# Patient Record
Sex: Female | Born: 1995 | Race: White | Hispanic: No | Marital: Single | Smoking: Never smoker
Health system: Southern US, Community
[De-identification: ages and names within clinical notes are randomized; demographics above are authoritative.]

## PROBLEM LIST (undated history)

## (undated) HISTORY — PX: TONSILLECTOMY: SUR1361

---
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
## 2018-04-16 ENCOUNTER — Other Ambulatory Visit: Payer: Self-pay

## 2018-04-16 ENCOUNTER — Encounter: Payer: Self-pay | Admitting: Family Medicine

## 2018-04-16 ENCOUNTER — Ambulatory Visit (INDEPENDENT_AMBULATORY_CARE_PROVIDER_SITE_OTHER): Payer: BLUE CROSS/BLUE SHIELD | Admitting: Family Medicine

## 2018-04-16 VITALS — BP 111/73 | HR 93 | Temp 100.4°F | Ht 62.0 in | Wt 158.0 lb

## 2018-04-16 DIAGNOSIS — J0100 Acute maxillary sinusitis, unspecified: Secondary | ICD-10-CM | POA: Diagnosis not present

## 2018-04-16 DIAGNOSIS — R6889 Other general symptoms and signs: Secondary | ICD-10-CM | POA: Diagnosis not present

## 2018-04-16 LAB — VERITOR FLU A/B WAIVED
Influenza A: NEGATIVE
Influenza B: NEGATIVE

## 2018-04-16 MED ORDER — AMOXICILLIN-POT CLAVULANATE 875-125 MG PO TABS
1.0000 | ORAL_TABLET | Freq: Two times a day (BID) | ORAL | 0 refills | Status: DC
Start: 2018-04-16 — End: 2018-04-18

## 2018-04-16 MED ORDER — FLUTICASONE PROPIONATE 50 MCG/ACT NA SUSP: 2.0000 | Freq: Every day | NASAL | 6 refills | Status: DC

## 2018-04-16 NOTE — Patient Instructions (Addendum)

## 2018-04-16 NOTE — Progress Notes (Signed)
    Subjective:     Sonia Manning is a 23 y.o. female who presents for evaluation of sinus pain. Symptoms include: clear rhinorrhea, congestion, facial pain, fevers, post nasal drip and sinus pressure. Onset of symptoms was 2 days ago. Symptoms have been gradually worsening since that time. Past history is significant for no history of pneumonia or bronchitis. Patient is a non-smoker. No cough, shortness of breath, or recent travel. No exposure to people who have recently traveled.   The following portions of the patient's history were reviewed and updated as appropriate: allergies, current medications, past family history, past medical history, past social history, past surgical history and problem list.  Review of Systems Constitutional: positive for chills and fevers Eyes: pressure behind eyes Ears, nose, mouth, throat, and face: positive for sinus pressure, rhinorrhea, and post nasal draiange Respiratory: negative Cardiovascular: negative Gastrointestinal: negative Musculoskeletal:negative Neurological: negative   Objective:    BP 111/73   Pulse 93   Temp (!) 100.4 F (38 C) (Oral)   Ht 5\' 2"  (1.575 m)   Wt 158 lb (71.7 kg)   LMP 04/16/2018 (Exact Date)   BMI 28.90 kg/m  General appearance: alert, cooperative, appears stated age and mild distress Head: Normocephalic, without obvious abnormality, atraumatic Eyes: conjunctivae/corneas clear. PERRL, EOM's intact. Fundi benign. Ears: normal TM's and external ear canals both ears Nose: clear and scant discharge, turbinates red, swollen, mild maxillary sinus tenderness bilateral Throat: abnormal findings: mild oropharyngeal erythema Neck: no adenopathy, no carotid bruit, no JVD, supple, symmetrical, trachea midline and thyroid not enlarged, symmetric, no tenderness/mass/nodules Lungs: clear to auscultation bilaterally Skin: Skin color, texture, turgor normal. No rashes or lesions Neurologic: Grossly normal     Influenza  negative in office.  Assessment:   Sonia Manning was seen today for headache, nausea, chills.  Diagnoses and all orders for this visit:  Acute non-recurrent maxillary sinusitis Due to sudden onset and worsening of symptoms, will treat with Augmentin. Symptomatic care discussed. Report any new or worsening symptoms.  -     amoxicillin-clavulanate (AUGMENTIN) 875-125 MG tablet; Take 1 tablet by mouth 2 (two) times daily for 7 days. -     fluticasone (FLONASE) 50 MCG/ACT nasal spray; Place 2 sprays into both nostrils daily.  Flu-like symptoms Influenza negative.  -     Veritor Flu A/B Waived     Plan:    Nasal saline sprays. Nasal steroids per medication orders. Augmentin per medication orders.   Return if symptoms worsen or fail to improve.  The above assessment and management plan was discussed with the patient. The patient verbalized understanding of and has agreed to the management plan. Patient is aware to call the clinic if symptoms fail to improve or worsen. Patient is aware when to return to the clinic for a follow-up visit. Patient educated on when it is appropriate to go to the emergency department.   Kari Baars, FNP-C [facility] [address] [place], [place] [address] [phone]

## 2018-04-18 ENCOUNTER — Telehealth: Payer: Self-pay | Admitting: *Deleted

## 2018-04-18 MED ORDER — DOXYCYCLINE HYCLATE 100 MG PO TABS: 100.0000 mg | ORAL_TABLET | Freq: Two times a day (BID) | ORAL | 0 refills | Status: DC

## 2018-04-18 NOTE — Telephone Encounter (Signed)
Pt states that augment is making her sick, nausea.  MMM gave verbal for switch to DOXY - pt aware

## 2019-12-11 ENCOUNTER — Ambulatory Visit
Admission: EM | Admit: 2019-12-11 | Discharge: 2019-12-11 | Disposition: A | Discharge status: Home / Self Care | Payer: Self-pay | Attending: Emergency Medicine | Admitting: Emergency Medicine

## 2019-12-11 ENCOUNTER — Encounter: Payer: Self-pay | Admitting: Emergency Medicine

## 2019-12-11 ENCOUNTER — Other Ambulatory Visit: Payer: Self-pay

## 2019-12-11 DIAGNOSIS — R509 Fever, unspecified: Secondary | ICD-10-CM

## 2019-12-11 DIAGNOSIS — R0602 Shortness of breath: Secondary | ICD-10-CM

## 2019-12-11 DIAGNOSIS — R059 Cough, unspecified: Secondary | ICD-10-CM

## 2019-12-11 MED ORDER — IBUPROFEN 800 MG PO TABS
800.0000 mg | ORAL_TABLET | Freq: Once | ORAL | Status: AC
  Administered 2019-12-11: 800 mg via ORAL

## 2019-12-11 MED ORDER — BENZONATATE 100 MG PO CAPS: 100.0000 mg | ORAL_CAPSULE | Freq: Three times a day (TID) | ORAL | 0 refills | Status: AC

## 2019-12-11 NOTE — ED Provider Notes (Signed)
[facility]   [id number] 12/11/19 Arrival Time: 1812   CC: cough, fever, SOB  SUBJECTIVE: History from: patient.  Sonia Manning is a 24 y.o. female who presents with green productive cough x 1 week, and SOB and fever, tmax 103, that started last night.  Denies sick exposure to COVID, flu or strep.  Has tried OTC medications without relief.  Symptoms are made worse with deep breath.  Reports previous covid infection in the past.   Denies sinus pain, rhinorrhea, sore throat, wheezing, chest pain, nausea, changes in bowel or bladder habits.    Denies calf pain or swelling, recent long travel, recent surgery, hormone use, tobacco use, malignancy, pregnancy, hx of blood clot.     ROS: As per HPI.  All other pertinent ROS negative.     History reviewed. No pertinent past medical history. History reviewed. No pertinent surgical history. No Known Allergies No current facility-administered medications on file prior to encounter.   Current Outpatient Medications on File Prior to Encounter  Medication Sig Dispense Refill  . doxycycline (VIBRA-TABS) 100 MG tablet Take 1 tablet (100 mg total) by mouth 2 (two) times daily. 1 po bid 20 tablet 0  . fluticasone (FLONASE) 50 MCG/ACT nasal spray Place 2 sprays into both nostrils daily. 16 g 6   Social History   Socioeconomic History  . Marital status: Single    Spouse name: Not on file  . Number of children: Not on file  . Years of education: Not on file  . Highest education level: Not on file  Occupational History  . Not on file  Tobacco Use  . Smoking status: Never Smoker  . Smokeless tobacco: Never Used  Vaping Use  . Vaping Use: Every day  Substance and Sexual Activity  . Alcohol use: No  . Drug use: No  . Sexual activity: Not on file  Other Topics Concern  . Not on file  Social History Narrative  . Not on file   Social Determinants of Health   Financial Resource Strain:   . Difficulty of Paying Living Expenses:  Not on file  Food Insecurity:   . Worried About Programme researcher, broadcasting/film/video in the Last Year: Not on file  . Ran Out of Food in the Last Year: Not on file  Transportation Needs:   . Lack of Transportation (Medical): Not on file  . Lack of Transportation (Non-Medical): Not on file  Physical Activity:   . Days of Exercise per Week: Not on file  . Minutes of Exercise per Session: Not on file  Stress:   . Feeling of Stress : Not on file  Social Connections:   . Frequency of Communication with Friends and Family: Not on file  . Frequency of Social Gatherings with Friends and Family: Not on file  . Attends Religious Services: Not on file  . Active Member of Clubs or Organizations: Not on file  . Attends Banker Meetings: Not on file  . Marital Status: Not on file  Intimate Partner Violence:   . Fear of Current or Ex-Partner: Not on file  . Emotionally Abused: Not on file  . Physically Abused: Not on file  . Sexually Abused: Not on file   Family History  Problem Relation Age of Onset  . Healthy Mother   . Hyperlipidemia Father   . Healthy Sister   . Healthy Brother   . Healthy Sister     OBJECTIVE:  Vitals:   12/11/19 1834  BP:  111/66  Pulse: (!) 134  Resp: 20  Temp: (!) 103.1 F (39.5 C)  TempSrc: Oral  SpO2: 98%     General appearance: alert; appears fatigued, but nontoxic; speaking in full sentences and tolerating own secretions HEENT: NCAT; Ears: EACs clear, TMs pearly gray; Eyes: PERRL.  EOM grossly intact. Nose: nares patent without rhinorrhea, Throat: oropharynx clear, tonsils non erythematous or enlarged, uvula midline  Neck: supple without LAD Lungs: unlabored respirations, symmetrical air entry; cough: mild; no respiratory distress; CTAB Heart: Tachycardia Skin: warm and dry Psychological: alert and cooperative; normal mood and affect   ASSESSMENT & PLAN:  1. Fever, unspecified   2. Cough   3. SOB (shortness of breath)     Meds ordered this  encounter  Medications  . ibuprofen (ADVIL) tablet 800 mg    Unable to rule out blood clot in urgent care setting.  Offered patient further evaluation and management in the ED.  Patient declines at this time and would like to try outpatient therapy first.  Aware of the risk associated with this decision including missed diagnosis, organ damage, organ failure, and/or death.  Patient aware and in agreement.     Declines chest x-ray at this time COVID testing ordered.  It will take between 5-7 days for test results.  Someone will contact you regarding abnormal results.    In the meantime: You should remain isolated in your home for 10 days from symptom onset AND greater than 72 hours after symptoms resolution (absence of fever without the use of fever-reducing medication and improvement in respiratory symptoms), whichever is longer Get plenty of rest and push fluids Tessalon Perles prescribed for cough Use OTC zyrtec for nasal congestion, runny nose, and/or sore throat Use OTC flonase for nasal congestion and runny nose Use medications daily for symptom relief Use OTC medications like ibuprofen or tylenol as needed fever or pain Call or go to the ED if you have any new or worsening symptoms such as fever, worsening cough, shortness of breath, chest tightness, chest pain, turning blue, changes in mental status, etc...   Reviewed expectations re: course of current medical issues. Questions answered. Outlined signs and symptoms indicating need for more acute intervention. Patient verbalized understanding. After Visit Summary given.         Rennis Harding, PA-C 12/11/19 1854

## 2019-12-11 NOTE — Discharge Instructions (Addendum)
Unable to rule out blood clot in urgent care setting.  Offered patient further evaluation and management in the ED.  Patient declines at this time and would like to try outpatient therapy first.  Aware of the risk associated with this decision including missed diagnosis, organ damage, organ failure, and/or death.  Patient aware and in agreement.     Declines chest x-ray at this time COVID testing ordered.  It will take between 5-7 days for test results.  Someone will contact you regarding abnormal results.    In the meantime: You should remain isolated in your home for 10 days from symptom onset AND greater than 72 hours after symptoms resolution (absence of fever without the use of fever-reducing medication and improvement in respiratory symptoms), whichever is longer Get plenty of rest and push fluids Tessalon Perles prescribed for cough Use OTC zyrtec for nasal congestion, runny nose, and/or sore throat Use OTC flonase for nasal congestion and runny nose Use medications daily for symptom relief Use OTC medications like ibuprofen or tylenol as needed fever or pain Call or go to the ED if you have any new or worsening symptoms such as fever, worsening cough, shortness of breath, chest tightness, chest pain, turning blue, changes in mental status, etc..Marland Kitchen

## 2019-12-11 NOTE — ED Triage Notes (Signed)
Pt has cough that started 1 week ago. Test covid neg by pcr from [facility] on Friday. Pt is now having shortness of breath and fever.

## 2019-12-12 ENCOUNTER — Emergency Department (HOSPITAL_COMMUNITY)
Admission: EM | Admit: 2019-12-12 | Discharge: 2019-12-12 | Disposition: A | Discharge status: Home / Self Care | Payer: Self-pay | Attending: Emergency Medicine | Admitting: Emergency Medicine

## 2019-12-12 ENCOUNTER — Encounter (HOSPITAL_COMMUNITY): Payer: Self-pay

## 2019-12-12 ENCOUNTER — Emergency Department (HOSPITAL_COMMUNITY): Payer: Self-pay

## 2019-12-12 ENCOUNTER — Other Ambulatory Visit: Payer: Self-pay

## 2019-12-12 DIAGNOSIS — R000 Tachycardia, unspecified: Secondary | ICD-10-CM | POA: Insufficient documentation

## 2019-12-12 DIAGNOSIS — R058 Other specified cough: Secondary | ICD-10-CM | POA: Insufficient documentation

## 2019-12-12 DIAGNOSIS — R509 Fever, unspecified: Secondary | ICD-10-CM | POA: Insufficient documentation

## 2019-12-12 DIAGNOSIS — J069 Acute upper respiratory infection, unspecified: Secondary | ICD-10-CM

## 2019-12-12 DIAGNOSIS — M791 Myalgia, unspecified site: Secondary | ICD-10-CM | POA: Insufficient documentation

## 2019-12-12 LAB — COVID-19, FLU A+B AND RSV
Influenza A, NAA: NOT DETECTED
Influenza B, NAA: NOT DETECTED
RSV, NAA: NOT DETECTED
SARS-CoV-2, NAA: NOT DETECTED

## 2019-12-12 MED ORDER — ACETAMINOPHEN 325 MG PO TABS
650.0000 mg | ORAL_TABLET | Freq: Once | ORAL | Status: AC | PRN
  Administered 2019-12-12: 650 mg via ORAL
  Filled 2019-12-12: qty 2

## 2019-12-12 NOTE — ED Triage Notes (Signed)
Pt presents to ED with complaints of fever up to 104 today and cough started last night. Pt seen at Urgent Care last night, Covid test pending.

## 2019-12-12 NOTE — Discharge Instructions (Addendum)
It was our pleasure to provide your ER care today - we hope that you feel better.  Rest. Drink plenty of fluids.  Take acetaminophen and/or ibuprofen as need for fever/body aches.   Follow up with your COVID/flu test results tomorrow.   Return to ER if worse, new symptoms, increased trouble breathing, new or severe pain, severe headache, abdominal pain, persistent vomiting, or other concern.

## 2019-12-12 NOTE — ED Provider Notes (Signed)
[facility] EMERGENCY DEPARTMENT Provider Note   CSN: [id number] Arrival date & time: 12/12/19  1653     History Chief Complaint  Patient presents with  . Fever    Sonia Manning is a 24 y.o. female.  Patient presents with fever, and non prod cough. Symptoms acute onset in past week, moderate, persistent. States covid test at onset symptoms neg, but that she also had a covid/rsv/flu test sent last evening - does not have those results yet. Denies sob. No chest pain. +body/muscle aches. No sore throat or runny nose. No sinus pain. No headache. No ear pain. No neck pain or stiffness. No abd or pelvic pain. No rash. No specific known ill contacts.   The history is provided by the patient.  Fever Associated symptoms: cough and myalgias   Associated symptoms: no chest pain, no confusion, no diarrhea, no dysuria, no ear pain, no headaches, no rash, no sore throat and no vomiting        History reviewed. No pertinent past medical history.  There are no problems to display for this patient.   Past Surgical History:  Procedure Laterality Date  . TONSILLECTOMY       OB History   No obstetric history on file.     Family History  Problem Relation Age of Onset  . Healthy Mother   . Hyperlipidemia Father   . Healthy Sister   . Healthy Brother   . Healthy Sister     Social History   Tobacco Use  . Smoking status: Never Smoker  . Smokeless tobacco: Never Used  Vaping Use  . Vaping Use: Every day  Substance Use Topics  . Alcohol use: No  . Drug use: No    Home Medications Prior to Admission medications   Medication Sig Start Date End Date Taking? Authorizing Provider  benzonatate (TESSALON) 100 MG capsule Take 1 capsule (100 mg total) by mouth every 8 (eight) hours. 12/11/19   Wurst, [place], PA-C  doxycycline (VIBRA-TABS) 100 MG tablet Take 1 tablet (100 mg total) by mouth 2 (two) times daily. 1 po bid 04/18/18   Daphine Deutscher, Mary-Margaret, FNP  fluticasone (FLONASE) 50  MCG/ACT nasal spray Place 2 sprays into both nostrils daily. 04/16/18   Sonny Masters, FNP    Allergies    Patient has no known allergies.  Review of Systems   Review of Systems  Constitutional: Positive for fever.  HENT: Negative for ear pain, sinus pain and sore throat.   Eyes: Negative for discharge and redness.  Respiratory: Positive for cough. Negative for shortness of breath.   Cardiovascular: Negative for chest pain.  Gastrointestinal: Negative for diarrhea and vomiting.  Genitourinary: Negative for dysuria, flank pain, vaginal bleeding and vaginal discharge.  Musculoskeletal: Positive for myalgias. Negative for back pain, neck pain and neck stiffness.  Skin: Negative for rash.  Neurological: Negative for headaches.  Hematological: Negative for adenopathy.  Psychiatric/Behavioral: Negative for confusion.    Physical Exam Updated Vital Signs BP 110/66 (BP Location: Right Arm)   Pulse (!) 127   Temp (!) 100.6 F (38.1 C) (Oral)   Resp 20   Ht 1.626 m (5\' 4" )   Wt 54.4 kg   LMP 12/03/2019   SpO2 99%   BMI 20.60 kg/m   Physical Exam Vitals and nursing note reviewed.  Constitutional:      Appearance: Normal appearance. She is well-developed.     Comments: Febrile.  HENT:     Head: Atraumatic.  Right Ear: Tympanic membrane normal.     Left Ear: Tympanic membrane normal.     Nose: Nose normal.     Mouth/Throat:     Mouth: Mucous membranes are moist.     Pharynx: Oropharynx is clear. No oropharyngeal exudate or posterior oropharyngeal erythema.  Eyes:     General: No scleral icterus.    Conjunctiva/sclera: Conjunctivae normal.     Pupils: Pupils are equal, round, and reactive to light.  Neck:     Trachea: No tracheal deviation.     Comments: No stiffness or rigidity.  Cardiovascular:     Rate and Rhythm: Regular rhythm. Tachycardia present.     Pulses: Normal pulses.     Heart sounds: Normal heart sounds. No murmur heard.  No friction rub. No gallop.     Pulmonary:     Effort: Pulmonary effort is normal. No respiratory distress.     Breath sounds: Normal breath sounds.  Abdominal:     General: Bowel sounds are normal. There is no distension.     Palpations: Abdomen is soft.     Tenderness: There is no abdominal tenderness. There is no guarding.  Genitourinary:    Comments: No cva tenderness.  Musculoskeletal:        General: No swelling.     Cervical back: Normal range of motion and neck supple. No rigidity or tenderness. No muscular tenderness.  Lymphadenopathy:     Cervical: No cervical adenopathy.  Skin:    General: Skin is warm and dry.     Findings: No rash.  Neurological:     Mental Status: She is alert.     Comments: Alert, speech normal. Steady gait.   Psychiatric:        Mood and Affect: Mood normal.     ED Results / Procedures / Treatments   Labs (all labs ordered are listed, but only abnormal results are displayed) Labs Reviewed - No data to display  EKG None  Radiology DG Chest Portable 1 View  Result Date: 12/12/2019 CLINICAL DATA:  Fever, cough, short of breath EXAM: PORTABLE CHEST 1 VIEW COMPARISON:  None. FINDINGS: The heart size and mediastinal contours are within normal limits. Both lungs are clear. The visualized skeletal structures are unremarkable. IMPRESSION: No active disease. Electronically Signed   By: Sharlet Salina M.D.   On: 12/12/2019 17:49    Procedures Procedures (including critical care time)  Medications Ordered in ED Medications  acetaminophen (TYLENOL) tablet 650 mg (650 mg Oral Given 12/12/19 1710)    ED Course  I have reviewed the triage vital signs and the nursing notes.  Pertinent labs & imaging results that were available during my care of the patient were reviewed by me and considered in my medical decision making (see chart for details).    MDM Rules/Calculators/A&P                         No meds pta except ibuprofen. Febrile on arrival. Acetaminophen, po  fluids.  Reviewed nursing notes and prior charts for additional history. Yesterday's covid test pending.  CXR.  CXR reviewed/interpreted by me - no pna.   Pt is breathing comfortably, is not hypoxic, and appears adequately hydrated, and in no acute distress.  Suspect viral syndrome/uri.   Rec f/u for her covid/flu results tomorrow.   Return precautions provided.     Final Clinical Impression(s) / ED Diagnoses Final diagnoses:  None    Rx / DC Orders  ED Discharge Orders    None       Cathren Laine, MD 12/12/19 [phone]

## 2019-12-13 ENCOUNTER — Encounter (HOSPITAL_COMMUNITY): Payer: Self-pay | Admitting: Emergency Medicine

## 2019-12-13 ENCOUNTER — Inpatient Hospital Stay (HOSPITAL_COMMUNITY)
Admission: EM | Admit: 2019-12-13 | Discharge: 2019-12-19 | DRG: 871 | Disposition: A | Discharge status: Home / Self Care | Payer: Self-pay | Attending: Internal Medicine | Admitting: Internal Medicine

## 2019-12-13 ENCOUNTER — Emergency Department (HOSPITAL_COMMUNITY): Payer: Self-pay

## 2019-12-13 ENCOUNTER — Other Ambulatory Visit: Payer: Self-pay

## 2019-12-13 DIAGNOSIS — E861 Hypovolemia: Secondary | ICD-10-CM | POA: Diagnosis present

## 2019-12-13 DIAGNOSIS — N179 Acute kidney failure, unspecified: Secondary | ICD-10-CM | POA: Diagnosis present

## 2019-12-13 DIAGNOSIS — E871 Hypo-osmolality and hyponatremia: Secondary | ICD-10-CM | POA: Diagnosis present

## 2019-12-13 DIAGNOSIS — R040 Epistaxis: Secondary | ICD-10-CM | POA: Diagnosis not present

## 2019-12-13 DIAGNOSIS — R6521 Severe sepsis with septic shock: Secondary | ICD-10-CM | POA: Diagnosis present

## 2019-12-13 DIAGNOSIS — D649 Anemia, unspecified: Secondary | ICD-10-CM | POA: Diagnosis present

## 2019-12-13 DIAGNOSIS — R911 Solitary pulmonary nodule: Secondary | ICD-10-CM | POA: Diagnosis present

## 2019-12-13 DIAGNOSIS — R109 Unspecified abdominal pain: Secondary | ICD-10-CM

## 2019-12-13 DIAGNOSIS — E876 Hypokalemia: Secondary | ICD-10-CM | POA: Diagnosis present

## 2019-12-13 DIAGNOSIS — A419 Sepsis, unspecified organism: Secondary | ICD-10-CM | POA: Diagnosis present

## 2019-12-13 DIAGNOSIS — Z20822 Contact with and (suspected) exposure to covid-19: Secondary | ICD-10-CM | POA: Diagnosis present

## 2019-12-13 DIAGNOSIS — B373 Candidiasis of vulva and vagina: Secondary | ICD-10-CM | POA: Diagnosis present

## 2019-12-13 DIAGNOSIS — N3000 Acute cystitis without hematuria: Secondary | ICD-10-CM

## 2019-12-13 DIAGNOSIS — N10 Acute tubulo-interstitial nephritis: Secondary | ICD-10-CM | POA: Diagnosis present

## 2019-12-13 DIAGNOSIS — D6959 Other secondary thrombocytopenia: Secondary | ICD-10-CM | POA: Diagnosis present

## 2019-12-13 DIAGNOSIS — N289 Disorder of kidney and ureter, unspecified: Secondary | ICD-10-CM

## 2019-12-13 DIAGNOSIS — A4151 Sepsis due to Escherichia coli [E. coli]: Principal | ICD-10-CM | POA: Diagnosis present

## 2019-12-13 DIAGNOSIS — R0602 Shortness of breath: Secondary | ICD-10-CM

## 2019-12-13 LAB — COMPREHENSIVE METABOLIC PANEL
ALT: 14 U/L (ref 0–44)
AST: 19 U/L (ref 15–41)
Albumin: 3.6 g/dL (ref 3.5–5.0)
Alkaline Phosphatase: 57 U/L (ref 38–126)
Anion gap: 13 (ref 5–15)
BUN: 16 mg/dL (ref 6–20)
CO2: 22 mmol/L (ref 22–32)
Calcium: 9.1 mg/dL (ref 8.9–10.3)
Chloride: 97 mmol/L — ABNORMAL LOW (ref 98–111)
Creatinine, Ser: 1.51 mg/dL — ABNORMAL HIGH (ref 0.44–1.00)
GFR, Estimated: 50 mL/min — ABNORMAL LOW (ref >60)
Glucose, Bld: 109 mg/dL — ABNORMAL HIGH (ref 70–99)
Potassium: 3.5 mmol/L (ref 3.5–5.1)
Sodium: 132 mmol/L — ABNORMAL LOW (ref 135–145)
Total Bilirubin: 1.2 mg/dL (ref 0.3–1.2)
Total Protein: 7.2 g/dL (ref 6.5–8.1)

## 2019-12-13 LAB — URINALYSIS, ROUTINE W REFLEX MICROSCOPIC
Bilirubin Urine: NEGATIVE
Glucose, UA: NEGATIVE mg/dL
Ketones, ur: 5 mg/dL — AB
Nitrite: NEGATIVE
Protein, ur: 100 mg/dL — AB
Specific Gravity, Urine: 1.009 (ref 1.005–1.030)
WBC, UA: >50 WBC/hpf — ABNORMAL HIGH (ref 0–5)
pH: 5 (ref 5.0–8.0)

## 2019-12-13 LAB — CBC WITH DIFFERENTIAL/PLATELET
Abs Immature Granulocytes: 0.12 10*3/uL — ABNORMAL HIGH (ref 0.00–0.07)
Basophils Absolute: 0 10*3/uL (ref 0.0–0.1)
Basophils Relative: 0 %
Eosinophils Absolute: 0 10*3/uL (ref 0.0–0.5)
Eosinophils Relative: 0 %
HCT: 35.7 % — ABNORMAL LOW (ref 36.0–46.0)
Hemoglobin: 12.2 g/dL (ref 12.0–15.0)
Immature Granulocytes: 1 %
Lymphocytes Relative: 4 %
Lymphs Abs: 0.5 10*3/uL — ABNORMAL LOW (ref 0.7–4.0)
MCH: 29.4 pg (ref 26.0–34.0)
MCHC: 34.2 g/dL (ref 30.0–36.0)
MCV: 86 fL (ref 80.0–100.0)
Monocytes Absolute: 1.8 10*3/uL — ABNORMAL HIGH (ref 0.1–1.0)
Monocytes Relative: 15 %
Neutro Abs: 9.8 10*3/uL — ABNORMAL HIGH (ref 1.7–7.7)
Neutrophils Relative %: 80 %
Platelets: 148 10*3/uL — ABNORMAL LOW (ref 150–400)
RBC: 4.15 MIL/uL (ref 3.87–5.11)
RDW: 13.5 % (ref 11.5–15.5)
WBC: 12.2 10*3/uL — ABNORMAL HIGH (ref 4.0–10.5)
nRBC: 0 % (ref 0.0–0.2)

## 2019-12-13 LAB — D-DIMER, QUANTITATIVE: D-Dimer, Quant: 2.12 ug/mL-FEU — ABNORMAL HIGH (ref 0.00–0.50)

## 2019-12-13 LAB — PROTIME-INR
INR: 1.1 (ref 0.8–1.2)
Prothrombin Time: 13.7 seconds (ref 11.4–15.2)

## 2019-12-13 LAB — LIPASE, BLOOD: Lipase: 28 U/L (ref 11–51)

## 2019-12-13 LAB — HCG, QUANTITATIVE, PREGNANCY: hCG, Beta Chain, Quant, S: 1 m[IU]/mL (ref <5)

## 2019-12-13 LAB — I-STAT BETA HCG BLOOD, ED (MC, WL, AP ONLY): I-stat hCG, quantitative: 23.1 m[IU]/mL — ABNORMAL HIGH (ref <5)

## 2019-12-13 LAB — LACTIC ACID, PLASMA: Lactic Acid, Venous: 1.8 mmol/L (ref 0.5–1.9)

## 2019-12-13 MED ORDER — SODIUM CHLORIDE 0.9 % IV BOLUS
1000.0000 mL | Freq: Once | INTRAVENOUS | Status: AC
  Administered 2019-12-13: 1000 mL via INTRAVENOUS

## 2019-12-13 MED ORDER — LACTATED RINGERS IV BOLUS
1000.0000 mL | Freq: Once | INTRAVENOUS | Status: AC
  Administered 2019-12-14: 1000 mL via INTRAVENOUS

## 2019-12-13 MED ORDER — SODIUM CHLORIDE 0.9 % IV SOLN
2.0000 g | Freq: Once | INTRAVENOUS | Status: AC
  Administered 2019-12-13: 2 g via INTRAVENOUS
  Filled 2019-12-13: qty 20

## 2019-12-13 MED ORDER — ONDANSETRON HCL 4 MG/2ML IJ SOLN
4.0000 mg | Freq: Once | INTRAMUSCULAR | Status: AC
  Administered 2019-12-13: 4 mg via INTRAVENOUS
  Filled 2019-12-13: qty 2

## 2019-12-13 MED ORDER — HYDROMORPHONE HCL 1 MG/ML IJ SOLN
0.5000 mg | Freq: Once | INTRAMUSCULAR | Status: AC
  Administered 2019-12-13: 0.5 mg via INTRAVENOUS
  Filled 2019-12-13: qty 1

## 2019-12-13 MED ORDER — IOHEXOL 350 MG/ML SOLN
100.0000 mL | Freq: Once | INTRAVENOUS | Status: AC | PRN
  Administered 2019-12-13: 100 mL via INTRAVENOUS

## 2019-12-13 MED ORDER — ACETAMINOPHEN 325 MG PO TABS
650.0000 mg | ORAL_TABLET | Freq: Once | ORAL | Status: AC | PRN
  Administered 2019-12-13: 650 mg via ORAL
  Filled 2019-12-13: qty 2

## 2019-12-13 MED ORDER — LACTATED RINGERS IV BOLUS
1000.0000 mL | Freq: Once | INTRAVENOUS | Status: AC
  Administered 2019-12-13: 1000 mL via INTRAVENOUS

## 2019-12-13 NOTE — ED Provider Notes (Signed)
[facility] EMERGENCY DEPARTMENT Provider Note   CSN: [id number] Arrival date & time: 12/13/19  1843     History Chief Complaint  Patient presents with  . Shortness of Breath    Sonia Manning is a 24 y.o. female.  The history is provided by the patient and medical records.  Shortness of Breath Severity:  Moderate Onset quality:  Gradual Duration:  3 days Timing:  Constant Progression:  Worsening Chronicity:  New Associated symptoms: abdominal pain, cough, fever, headaches, sore throat and vomiting   Associated symptoms: no chest pain and no neck pain   Abdominal pain:    Location:  LLQ   Quality: cramping     Severity:  Severe   Onset quality:  Sudden   Duration:  1 day   Timing:  Constant   Chronicity:  New Cough:    Cough characteristics:  Non-productive   Severity:  Moderate   Duration:  3 days   Timing:  Constant Cough Associated symptoms: chills, fever, headaches, shortness of breath and sore throat   Associated symptoms: no chest pain   Abdominal Pain Associated symptoms: chills, cough, fatigue, fever, nausea, shortness of breath, sore throat and vomiting   Associated symptoms: no chest pain, no diarrhea, no dysuria and no hematuria        History reviewed. No pertinent past medical history.  There are no problems to display for this patient.   Past Surgical History:  Procedure Laterality Date  . TONSILLECTOMY       OB History   No obstetric history on file.     Family History  Problem Relation Age of Onset  . Healthy Mother   . Hyperlipidemia Father   . Healthy Sister   . Healthy Brother   . Healthy Sister     Social History   Tobacco Use  . Smoking status: Never Smoker  . Smokeless tobacco: Never Used  Vaping Use  . Vaping Use: Every day  Substance Use Topics  . Alcohol use: No  . Drug use: No    Home Medications Prior to Admission medications   Medication Sig Start Date End Date Taking? Authorizing  Provider  benzonatate (TESSALON) 100 MG capsule Take 1 capsule (100 mg total) by mouth every 8 (eight) hours. 12/11/19   Wurst, [place], PA-C  doxycycline (VIBRA-TABS) 100 MG tablet Take 1 tablet (100 mg total) by mouth 2 (two) times daily. 1 po bid 04/18/18   Daphine Deutscher, Mary-Margaret, FNP  fluticasone (FLONASE) 50 MCG/ACT nasal spray Place 2 sprays into both nostrils daily. 04/16/18   Sonny Masters, FNP    Allergies    Patient has no known allergies.  Review of Systems   Review of Systems  Constitutional: Positive for chills, fatigue and fever.  HENT: Positive for sore throat. Negative for congestion and trouble swallowing.   Eyes: Negative for visual disturbance.  Respiratory: Positive for cough and shortness of breath.   Cardiovascular: Negative for chest pain and leg swelling.  Gastrointestinal: Positive for abdominal pain, nausea and vomiting. Negative for diarrhea.  Genitourinary: Positive for flank pain. Negative for difficulty urinating, dysuria and hematuria.       Bilateral flanks  Musculoskeletal: Negative for back pain and neck pain.  Skin: Negative for color change.  Neurological: Positive for headaches. Negative for dizziness and weakness.  All other systems reviewed and are negative.   Physical Exam Updated Vital Signs BP 95/62   Pulse 96   Temp 99 F (37.2 C) (Oral)  Resp 15   Ht 5\' 4"  (1.626 m)   Wt 54.4 kg   LMP 12/03/2019 Comment: neg preg  SpO2 94%   BMI 20.60 kg/m   Physical Exam Vitals reviewed.  Constitutional:      Appearance: She is normal weight. She is not toxic-appearing.  HENT:     Head: Normocephalic and atraumatic.     Nose: Nose normal.     Mouth/Throat:     Mouth: Mucous membranes are moist.     Pharynx: Oropharynx is clear. No oropharyngeal exudate or posterior oropharyngeal erythema.  Eyes:     Conjunctiva/sclera: Conjunctivae normal.  Cardiovascular:     Rate and Rhythm: Tachycardia present.     Heart sounds: Normal heart sounds.    Pulmonary:     Effort: Pulmonary effort is normal. No respiratory distress.     Breath sounds: Normal breath sounds. No wheezing, rhonchi or rales.  Abdominal:     General: Abdomen is flat.     Palpations: Abdomen is soft.     Tenderness: There is abdominal tenderness.  Musculoskeletal:     Cervical back: Neck supple. No tenderness.     Right lower leg: No edema.     Left lower leg: No edema.  Skin:    General: Skin is warm and dry.  Neurological:     Mental Status: She is alert.  Psychiatric:        Mood and Affect: Mood normal.        Behavior: Behavior normal.     ED Results / Procedures / Treatments   Labs (all labs ordered are listed, but only abnormal results are displayed) Labs Reviewed  COMPREHENSIVE METABOLIC PANEL - Abnormal; Notable for the following components:      Result Value   Sodium 132 (*)    Chloride 97 (*)    Glucose, Bld 109 (*)    Creatinine, Ser 1.51 (*)    GFR, Estimated 50 (*)    All other components within normal limits  CBC WITH DIFFERENTIAL/PLATELET - Abnormal; Notable for the following components:   WBC 12.2 (*)    HCT 35.7 (*)    Platelets 148 (*)    Neutro Abs 9.8 (*)    Lymphs Abs 0.5 (*)    Monocytes Absolute 1.8 (*)    Abs Immature Granulocytes 0.12 (*)    All other components within normal limits  URINALYSIS, ROUTINE W REFLEX MICROSCOPIC - Abnormal; Notable for the following components:   APPearance CLOUDY (*)    Hgb urine dipstick MODERATE (*)    Ketones, ur 5 (*)    Protein, ur 100 (*)    Leukocytes,Ua LARGE (*)    WBC, UA >50 (*)    Bacteria, UA MANY (*)    Non Squamous Epithelial 0-5 (*)    All other components within normal limits  D-DIMER, QUANTITATIVE (NOT AT [facility]) - Abnormal; Notable for the following components:   D-Dimer, Quant 2.12 (*)    All other components within normal limits  I-STAT BETA HCG BLOOD, ED ([facility], [facility], [facility] ONLY) - Abnormal; Notable for the following components:   I-stat hCG, quantitative 23.1 (*)     All other components within normal limits  CULTURE, BLOOD (ROUTINE X 2)  CULTURE, BLOOD (ROUTINE X 2)  RESPIRATORY PANEL BY RT PCR (FLU A&B, COVID)  URINE CULTURE  LACTIC ACID, PLASMA  PROTIME-INR  HCG, QUANTITATIVE, PREGNANCY  LIPASE, BLOOD  LACTIC ACID, PLASMA    EKG None  Radiology DG Chest 2  View  Result Date: 12/13/2019 CLINICAL DATA:  Sepsis. Shortness of breath with upper abdominal pain. EXAM: CHEST - 2 VIEW COMPARISON:  December 12, 2019 FINDINGS: The heart size and mediastinal contours are within normal limits. Both lungs are clear. The visualized skeletal structures are unremarkable. IMPRESSION: No active cardiopulmonary disease. Electronically Signed   By: Katherine Mantle M.D.   On: 12/13/2019 19:35   DG Chest Portable 1 View  Result Date: 12/12/2019 CLINICAL DATA:  Fever, cough, short of breath EXAM: PORTABLE CHEST 1 VIEW COMPARISON:  None. FINDINGS: The heart size and mediastinal contours are within normal limits. Both lungs are clear. The visualized skeletal structures are unremarkable. IMPRESSION: No active disease. Electronically Signed   By: Sharlet Salina M.D.   On: 12/12/2019 17:49    Procedures Procedures (including critical care time)  Medications Ordered in ED Medications  lactated ringers bolus 1,000 mL (has no administration in time range)  acetaminophen (TYLENOL) tablet 650 mg (650 mg Oral Given 12/13/19 1909)  ondansetron (ZOFRAN) injection 4 mg (4 mg Intravenous Given 12/13/19 2109)  lactated ringers bolus 1,000 mL (1,000 mLs Intravenous New Bag/Given 12/13/19 2108)  sodium chloride 0.9 % bolus 1,000 mL (1,000 mLs Intravenous New Bag/Given 12/13/19 2119)  HYDROmorphone (DILAUDID) injection 0.5 mg (0.5 mg Intravenous Given 12/13/19 2119)  cefTRIAXone (ROCEPHIN) 2 g in sodium chloride 0.9 % 100 mL IVPB (2 g Intravenous New Bag/Given 12/13/19 2309)  iohexol (OMNIPAQUE) 350 MG/ML injection 100 mL (100 mLs Intravenous Contrast Given 12/13/19 2331)     ED Course  I have reviewed the triage vital signs and the nursing notes.  Pertinent labs & imaging results that were available during my care of the patient were reviewed by me and considered in my medical decision making (see chart for details).    MDM Rules/Calculators/A&P                           Medical Decision Making: Glorious Flicker is a 24 y.o. female who presented to the ED today with SOB and cough for 3 days and 1 day of abdominal pain radiating into lower back.  Pt reports fever and cough for 3 days, seen in ED 2x, COVID test x2 negative..  12/12/19- Pt evaluated in the ED yesterday for fever and cough. Hemodynamically stable, CXR unremarkable, consistent with viral URI, dc home in stable condition.  Return today for continued fever and cough now with abdominal pain and vomiting. Denies diarrhea. No urinary sx, no vaginal sx.  No significant past medical history. Reviewed and confirmed nursing documentation for past medical history, family history, social history.  On my initial exam, the pt was uncomfortable appearing, normal WOB, lungs CTAB, diffuse abdominal tenderness and BL CVA tenderness.   Concern for sepsis given fever, tachycardia, tachypnea, and hypotension. CXR without cardiopulmonary abnormality, no consolidations, doubt pneumonia.  COVID and flu yesterday negative UA with leukocytes, concerning for infection. Empiric abx given.  D-dimer elevated, will obtain CTA chest.  CT abdomen ordered.  Clinical picture consistent with urosepsis likely pyelonephritis. S/p abx and fluids.  Consults: none performed  All radiology and laboratory studies reviewed independently and with my attending physician, agree with reading provided by radiologist unless otherwise noted.   Given the above findings, I believe that the pt requires admission.    PLAN: Follow up CTA chest and CT Abdomen  Admit to Hospitalist service for tx of urosepsis   Pt care handed off to  [employer]  PA-C at 12 pm, please see her note for remainder of pt care and ultimate disposition.    The above care was discussed with and agreed upon by my attending physician.    Emergency Department Medication Summary:  Medications  lactated ringers bolus 1,000 mL (has no administration in time range)  acetaminophen (TYLENOL) tablet 650 mg (650 mg Oral Given 12/13/19 1909)  ondansetron (ZOFRAN) injection 4 mg (4 mg Intravenous Given 12/13/19 2109)  lactated ringers bolus 1,000 mL (1,000 mLs Intravenous New Bag/Given 12/13/19 2108)  sodium chloride 0.9 % bolus 1,000 mL (1,000 mLs Intravenous New Bag/Given 12/13/19 2119)  HYDROmorphone (DILAUDID) injection 0.5 mg (0.5 mg Intravenous Given 12/13/19 2119)  cefTRIAXone (ROCEPHIN) 2 g in sodium chloride 0.9 % 100 mL IVPB (2 g Intravenous New Bag/Given 12/13/19 2309)  iohexol (OMNIPAQUE) 350 MG/ML injection 100 mL (100 mLs Intravenous Contrast Given 12/13/19 2331)      Final Clinical Impression(s) / ED Diagnoses Final diagnoses:  Shortness of breath  Acute cystitis without hematuria    Rx / DC Orders ED Discharge Orders    None       Brantley Fling, MD 12/14/19 0001    Cathren Laine, MD 12/14/19 1422

## 2019-12-13 NOTE — ED Triage Notes (Signed)
Pt c/o shortness of breath, and upper abdominal pain x 3 days. Seen at [facility] and [facility] in the last 2 days. Pt febrile, negative covid test.

## 2019-12-14 ENCOUNTER — Encounter (HOSPITAL_COMMUNITY): Payer: Self-pay | Admitting: Family Medicine

## 2019-12-14 ENCOUNTER — Other Ambulatory Visit: Payer: Self-pay

## 2019-12-14 ENCOUNTER — Inpatient Hospital Stay (HOSPITAL_COMMUNITY): Payer: Self-pay

## 2019-12-14 DIAGNOSIS — N289 Disorder of kidney and ureter, unspecified: Secondary | ICD-10-CM

## 2019-12-14 DIAGNOSIS — R6521 Severe sepsis with septic shock: Secondary | ICD-10-CM

## 2019-12-14 DIAGNOSIS — N390 Urinary tract infection, site not specified: Secondary | ICD-10-CM

## 2019-12-14 DIAGNOSIS — R911 Solitary pulmonary nodule: Secondary | ICD-10-CM | POA: Diagnosis present

## 2019-12-14 DIAGNOSIS — E871 Hypo-osmolality and hyponatremia: Secondary | ICD-10-CM | POA: Diagnosis present

## 2019-12-14 DIAGNOSIS — A419 Sepsis, unspecified organism: Secondary | ICD-10-CM | POA: Diagnosis present

## 2019-12-14 LAB — CBC WITH DIFFERENTIAL/PLATELET
Abs Immature Granulocytes: 0.17 10*3/uL — ABNORMAL HIGH (ref 0.00–0.07)
Abs Immature Granulocytes: 0 10*3/uL (ref 0.00–0.07)
Basophils Absolute: 0.1 10*3/uL (ref 0.0–0.1)
Basophils Absolute: 0 10*3/uL (ref 0.0–0.1)
Basophils Relative: 1 %
Basophils Relative: 0 %
Eosinophils Absolute: 0 10*3/uL (ref 0.0–0.5)
Eosinophils Absolute: 0 10*3/uL (ref 0.0–0.5)
Eosinophils Relative: 0 %
Eosinophils Relative: 0 %
HCT: 29.2 % — ABNORMAL LOW (ref 36.0–46.0)
HCT: 26.7 % — ABNORMAL LOW (ref 36.0–46.0)
Hemoglobin: 9.7 g/dL — ABNORMAL LOW (ref 12.0–15.0)
Hemoglobin: 9 g/dL — ABNORMAL LOW (ref 12.0–15.0)
Immature Granulocytes: 2 %
Lymphocytes Relative: 8 %
Lymphocytes Relative: 10 %
Lymphs Abs: 0.8 10*3/uL (ref 0.7–4.0)
Lymphs Abs: 1.1 10*3/uL (ref 0.7–4.0)
MCH: 29.7 pg (ref 26.0–34.0)
MCH: 29.3 pg (ref 26.0–34.0)
MCHC: 33.2 g/dL (ref 30.0–36.0)
MCHC: 33.7 g/dL (ref 30.0–36.0)
MCV: 89.3 fL (ref 80.0–100.0)
MCV: 87 fL (ref 80.0–100.0)
Monocytes Absolute: 0.6 10*3/uL (ref 0.1–1.0)
Monocytes Absolute: 0.4 10*3/uL (ref 0.1–1.0)
Monocytes Relative: 7 %
Monocytes Relative: 4 %
Neutro Abs: 7.9 10*3/uL — ABNORMAL HIGH (ref 1.7–7.7)
Neutro Abs: 9.5 10*3/uL — ABNORMAL HIGH (ref 1.7–7.7)
Neutrophils Relative %: 82 %
Neutrophils Relative %: 86 %
Platelets: 105 10*3/uL — ABNORMAL LOW (ref 150–400)
Platelets: UNDETERMINED 10*3/uL (ref 150–400)
RBC: 3.27 MIL/uL — ABNORMAL LOW (ref 3.87–5.11)
RBC: 3.07 MIL/uL — ABNORMAL LOW (ref 3.87–5.11)
RDW: 13.7 % (ref 11.5–15.5)
RDW: 14 % (ref 11.5–15.5)
WBC Morphology: INCREASED
WBC: 9.6 10*3/uL (ref 4.0–10.5)
WBC: 11 10*3/uL — ABNORMAL HIGH (ref 4.0–10.5)
nRBC: 0 % (ref 0.0–0.2)
nRBC: 0 % (ref 0.0–0.2)
nRBC: 0 /100 WBC

## 2019-12-14 LAB — GLUCOSE, CAPILLARY: Glucose-Capillary: 83 mg/dL (ref 70–99)

## 2019-12-14 LAB — BASIC METABOLIC PANEL
Anion gap: 11 (ref 5–15)
BUN: 13 mg/dL (ref 6–20)
CO2: 22 mmol/L (ref 22–32)
Calcium: 8 mg/dL — ABNORMAL LOW (ref 8.9–10.3)
Chloride: 103 mmol/L (ref 98–111)
Creatinine, Ser: 1.2 mg/dL — ABNORMAL HIGH (ref 0.44–1.00)
GFR, Estimated: >60 mL/min (ref >60)
Glucose, Bld: 117 mg/dL — ABNORMAL HIGH (ref 70–99)
Potassium: 3.2 mmol/L — ABNORMAL LOW (ref 3.5–5.1)
Sodium: 136 mmol/L (ref 135–145)

## 2019-12-14 LAB — LACTIC ACID, PLASMA
Lactic Acid, Venous: 1.7 mmol/L (ref 0.5–1.9)
Lactic Acid, Venous: 0.8 mmol/L (ref 0.5–1.9)
Lactic Acid, Venous: 0.7 mmol/L (ref 0.5–1.9)

## 2019-12-14 LAB — FIBRINOGEN: Fibrinogen: 526 mg/dL — ABNORMAL HIGH (ref 210–475)

## 2019-12-14 LAB — RESPIRATORY PANEL BY RT PCR (FLU A&B, COVID)
Influenza A by PCR: NEGATIVE
Influenza B by PCR: NEGATIVE
SARS Coronavirus 2 by RT PCR: NEGATIVE

## 2019-12-14 LAB — TYPE AND SCREEN
ABO/RH(D): A POS
Antibody Screen: NEGATIVE

## 2019-12-14 LAB — ABO/RH: ABO/RH(D): A POS

## 2019-12-14 LAB — HIV ANTIBODY (ROUTINE TESTING W REFLEX): HIV Screen 4th Generation wRfx: NONREACTIVE

## 2019-12-14 LAB — PROTIME-INR
INR: 1.4 — ABNORMAL HIGH (ref 0.8–1.2)
Prothrombin Time: 16.6 seconds — ABNORMAL HIGH (ref 11.4–15.2)

## 2019-12-14 LAB — MRSA PCR SCREENING: MRSA by PCR: NEGATIVE

## 2019-12-14 LAB — APTT: aPTT: 41 seconds — ABNORMAL HIGH (ref 24–36)

## 2019-12-14 LAB — D-DIMER, QUANTITATIVE: D-Dimer, Quant: 3.05 ug/mL-FEU — ABNORMAL HIGH (ref 0.00–0.50)

## 2019-12-14 MED ORDER — SENNOSIDES-DOCUSATE SODIUM 8.6-50 MG PO TABS: 1.0000 | ORAL_TABLET | Freq: Every evening | ORAL | Status: DC | PRN

## 2019-12-14 MED ORDER — VANCOMYCIN HCL 1250 MG/250ML IV SOLN
1250.0000 mg | Freq: Once | INTRAVENOUS | Status: AC
  Administered 2019-12-14: 1250 mg via INTRAVENOUS
  Filled 2019-12-14: qty 250

## 2019-12-14 MED ORDER — SODIUM CHLORIDE 0.9 % IV SOLN: INTRAVENOUS | Status: AC

## 2019-12-14 MED ORDER — ONDANSETRON HCL 4 MG/2ML IJ SOLN
4.0000 mg | Freq: Four times a day (QID) | INTRAMUSCULAR | Status: DC | PRN
  Administered 2019-12-14 – 2019-12-16 (×7): 4 mg via INTRAVENOUS
  Filled 2019-12-14 (×7): qty 2

## 2019-12-14 MED ORDER — POTASSIUM CHLORIDE CRYS ER 20 MEQ PO TBCR
40.0000 meq | EXTENDED_RELEASE_TABLET | ORAL | Status: AC
  Administered 2019-12-14 (×2): 40 meq via ORAL
  Filled 2019-12-14 (×2): qty 2

## 2019-12-14 MED ORDER — HYDROCODONE-ACETAMINOPHEN 5-325 MG PO TABS
1.0000 | ORAL_TABLET | ORAL | Status: DC | PRN
  Administered 2019-12-14 – 2019-12-17 (×2): 1 via ORAL
  Filled 2019-12-14 (×2): qty 1

## 2019-12-14 MED ORDER — CHLORHEXIDINE GLUCONATE CLOTH 2 % EX PADS
6.0000 | MEDICATED_PAD | Freq: Every day | CUTANEOUS | Status: DC
  Administered 2019-12-14 – 2019-12-18 (×5): 6 via TOPICAL

## 2019-12-14 MED ORDER — ACETAMINOPHEN 650 MG RE SUPP: 650.0000 mg | Freq: Four times a day (QID) | RECTAL | Status: DC | PRN

## 2019-12-14 MED ORDER — ENOXAPARIN SODIUM 40 MG/0.4ML ~~LOC~~ SOLN
40.0000 mg | SUBCUTANEOUS | Status: DC
  Administered 2019-12-14 – 2019-12-19 (×6): 40 mg via SUBCUTANEOUS
  Filled 2019-12-14 (×6): qty 0.4

## 2019-12-14 MED ORDER — SALINE SPRAY 0.65 % NA SOLN
1.0000 | NASAL | Status: DC | PRN
  Filled 2019-12-14: qty 44

## 2019-12-14 MED ORDER — SODIUM CHLORIDE 0.9 % IV SOLN: INTRAVENOUS | Status: DC

## 2019-12-14 MED ORDER — HYDROMORPHONE HCL 1 MG/ML IJ SOLN
1.0000 mg | INTRAMUSCULAR | Status: DC | PRN
  Administered 2019-12-14 – 2019-12-15 (×5): 1 mg via INTRAVENOUS
  Filled 2019-12-14 (×5): qty 1

## 2019-12-14 MED ORDER — ACETAMINOPHEN 325 MG PO TABS
650.0000 mg | ORAL_TABLET | Freq: Four times a day (QID) | ORAL | Status: DC | PRN
  Administered 2019-12-14 – 2019-12-15 (×5): 650 mg via ORAL
  Filled 2019-12-14 (×6): qty 2

## 2019-12-14 MED ORDER — SODIUM CHLORIDE 0.9 % IV SOLN
1.0000 g | Freq: Three times a day (TID) | INTRAVENOUS | Status: DC
  Administered 2019-12-14 – 2019-12-16 (×7): 1 g via INTRAVENOUS
  Filled 2019-12-14 (×9): qty 1

## 2019-12-14 MED ORDER — SODIUM CHLORIDE 0.9 % IV SOLN: 2.0000 g | INTRAVENOUS | Status: DC

## 2019-12-14 MED ORDER — BISACODYL 5 MG PO TBEC: 5.0000 mg | DELAYED_RELEASE_TABLET | Freq: Every day | ORAL | Status: DC | PRN

## 2019-12-14 MED ORDER — POTASSIUM CHLORIDE CRYS ER 20 MEQ PO TBCR
40.0000 meq | EXTENDED_RELEASE_TABLET | Freq: Once | ORAL | Status: AC
  Administered 2019-12-14: 40 meq via ORAL
  Filled 2019-12-14: qty 2

## 2019-12-14 MED ORDER — LACTATED RINGERS IV BOLUS
1000.0000 mL | Freq: Once | INTRAVENOUS | Status: AC
  Administered 2019-12-14: 1000 mL via INTRAVENOUS

## 2019-12-14 MED ORDER — ONDANSETRON HCL 4 MG PO TABS: 4.0000 mg | ORAL_TABLET | Freq: Four times a day (QID) | ORAL | Status: DC | PRN

## 2019-12-14 MED ORDER — NOREPINEPHRINE 4 MG/250ML-% IV SOLN
2.0000 ug/min | INTRAVENOUS | Status: DC
  Administered 2019-12-14: 2 ug/min via INTRAVENOUS
  Filled 2019-12-14: qty 250

## 2019-12-14 MED ORDER — GUAIFENESIN-DM 100-10 MG/5ML PO SYRP
5.0000 mL | ORAL_SOLUTION | ORAL | Status: DC | PRN
  Administered 2019-12-14 – 2019-12-16 (×4): 5 mL via ORAL
  Filled 2019-12-14 (×4): qty 10

## 2019-12-14 MED ORDER — SODIUM CHLORIDE 0.9 % IV SOLN
250.0000 mL | INTRAVENOUS | Status: DC
  Administered 2019-12-14: 250 mL via INTRAVENOUS

## 2019-12-14 NOTE — ED Notes (Signed)
US at Bedside

## 2019-12-14 NOTE — Progress Notes (Signed)
PROGRESS NOTE    Sonia Manning  ASN:[id number] DOB: Dec 22, 1995 DOA: 12/13/2019 PCP: Dettinger, Elige Radon, MD   Brief Narrative:  HPI: Sonia Manning is a 24 y.o. female who denies any significant past medical history now presents emergency department for evaluation of fevers, cough, aches, right flank pain, and suprapubic discomfort.  Patient has had several days of nonproductive cough and then developed fevers, flank pain, and suprapubic discomfort 3 days ago.  She has not been particularly short of breath and denied dysuria or gross hematuria.  She had negative COVID-19, RSV, and influenza testing on 12/11/2019 and was seen in the [facility] emergency department on 12/12/2019 with negative chest x-ray and stable vital signs.  She has since developed worsening flank pain, continues to have high fevers, and returns to the ED tonight for that reason.  ED Course: Upon arrival to the ED, patient is found to be febrile to 40 C, saturating mid 90s on room air, tachycardic to 140, and with systolic blood pressure in the 90s.  EKG features a sinus rhythm and chest x-rays negative for acute cardiopulmonary disease.  CTA chest is negative for PE but notable for lung nodules.  CT of the abdomen and pelvis features a complex cystic structure involving the right kidney measuring 1.9 cm, as well as urinary bladder wall thickening and fat stranding.  Chemistry panel with sodium 132 and creatinine 1.51.  CBC is notable for leukocytosis to 12,200 and a slight thrombocytopenia.  Lactic acid is reassuringly normal.  Blood cultures were collected in the emergency department and the patient was treated with 3 L of IV fluids and 2 g IV Rocephin.  COVID-19 and influenza testing is negative.  Assessment & Plan:   Principal Problem:   Sepsis due to urinary tract infection (HCC) Active Problems:   Mild renal insufficiency   Lung nodule   Hyponatremia   Kidney lesion, native, right   Septic shock secondary to  UTI/left pyelonephritis: CT abdomen and pelvis suspecting left pyelonephritis.  Right renal cyst, no abscess seen.  Patient has received almost 5 L of IV fluid so far in the ED.  Patient's blood pressure remains low with a MAP Around 60.  Lactic acid normal.  Will check lactic acid again.  Last fever about an hour ago 101.9.  Leukocytosis resolved.  Due to low map despite of significant IV fluid, I think she needs vasopressors.  I called and discussed with PCCM and they have decided to take her to the ICU and start vasopressors.  Continue current antibiotics and follow culture.  Acute kidney injury: Presented with 1.5 of creatinine and now 1.2.  Improving.  Continue IV fluids.  Hypokalemia: 3.2.  Will replace orally.  Recheck in the morning.  Thrombocytopenia: Some more drop in platelet.  Some drop in hemoglobin as well.  Likely secondary to septicemia.  DIC work-up in process.  Work-up so far not consistent with DIC.  Hyponatremia: Resolved.  DVT prophylaxis: enoxaparin (LOVENOX) injection 40 mg Start: 12/14/19 0600   Code Status: Full Code  Family Communication: None present at bedside.  Plan of care discussed with patient in length and he verbalized understanding and agreed with it.  Status is: Inpatient  Remains inpatient appropriate because:Hemodynamically unstable   Dispo: The patient is from: Home              Anticipated d/c is to: Home              Anticipated d/c date is: 2 days  Patient currently is not medically stable to d/c.        Estimated body mass index is 20.6 kg/m as calculated from the following:   Height as of this encounter:  (1.626 m).   Weight as of this encounter: 54.4 kg.      Nutritional status:               Consultants:   PCCM  Procedures:   None  Antimicrobials:  Anti-infectives (From admission, onward)   Start     Dose/Rate Route Frequency Ordered Stop   12/14/19 2245  cefTRIAXone (ROCEPHIN) 2 g in sodium  chloride 0.9 % 100 mL IVPB  Status:  Discontinued        2 g 200 mL/hr over 30 Minutes Intravenous Every 24 hours 12/14/19 0113 12/14/19 0627   12/14/19 0630  vancomycin (VANCOREADY) IVPB 1250 mg/250 mL        1,250 mg 166.7 mL/hr over 90 Minutes Intravenous  Once 12/14/19 0618 12/14/19 0848   12/14/19 0630  meropenem (MERREM) 1 g in sodium chloride 0.9 % 100 mL IVPB        1 g 200 mL/hr over 30 Minutes Intravenous Every 8 hours 12/14/19 0629     12/13/19 2245  cefTRIAXone (ROCEPHIN) 2 g in sodium chloride 0.9 % 100 mL IVPB        2 g 200 mL/hr over 30 Minutes Intravenous  Once 12/13/19 2240 12/14/19 0005         Subjective: Patient seen and examined.  She states that she now feels slightly better but he still has left flank pain and suprapubic as well as left lower abdominal pain.  She tells me that she was having some problem breathing earlier but now feels better.  Objective: Vitals:   12/14/19 0800 12/14/19 0815 12/14/19 0830 12/14/19 0845  BP: (!) 82/51 (!) 83/50 (!) 82/52 (!) 80/54  Pulse: (!) 116 (!) 112 (!) 111 (!) 116  Resp: (!) 23 (!) 23 (!) 21 16  Temp:      TempSrc:      SpO2: 94% 96% 95% 99%  Weight:      Height:        Intake/Output Summary (Last 24 hours) at 12/14/2019 0900 Last data filed at 12/14/2019 0128 Gross per 24 hour  Intake 1900 ml  Output --  Net 1900 ml   Filed Weights   12/13/19 1902 12/14/19 0336  Weight: 54.4 kg 54.4 kg    Examination:  General exam: Appears toxic. Respiratory system: Clear to auscultation with some diminished breath sounds at the bases. Respiratory effort normal. Cardiovascular system: S1 & S2 heard, RRR. No JVD, murmurs, rubs, gallops or clicks. No pedal edema. Gastrointestinal system: Abdomen is nondistended, soft and suprapubic, left lower quadrant as well as left and right CVA tenderness. No organomegaly or masses felt. Normal bowel sounds heard. Central nervous system: Alert and oriented. No focal neurological  deficits. Extremities: Symmetric 5 x 5 power. Skin: No rashes, lesions or ulcers Psychiatry: Judgement and insight appear normal. Mood & affect appropriate.    Data Reviewed: I have personally reviewed following labs and imaging studies  CBC: Recent Labs  Lab 12/13/19 1916 12/14/19 0342  WBC 12.2* 9.6  NEUTROABS 9.8* 7.9*  HGB 12.2 9.7*  HCT 35.7* 29.2*  MCV 86.0 89.3  PLT 148* 105*   Basic Metabolic Panel: Recent Labs  Lab 12/13/19 1916 12/14/19 0342  NA 132* 136  K 3.5 3.2*  CL 97* 103  CO2 22 22  GLUCOSE 109* 117*  BUN 16 13  CREATININE 1.51* 1.20*  CALCIUM 9.1 8.0*   GFR: Estimated Creatinine Clearance: 62.6 mL/min (A) (by C-G formula based on SCr of 1.2 mg/dL (H)). Liver Function Tests: Recent Labs  Lab 12/13/19 1916  AST 19  ALT 14  ALKPHOS 57  BILITOT 1.2  PROT 7.2  ALBUMIN 3.6   Recent Labs  Lab 12/13/19 2051  LIPASE 28   No results for input(s): AMMONIA in the last 168 hours. Coagulation Profile: Recent Labs  Lab 12/13/19 1916 12/14/19 0706  INR 1.1 1.4*   Cardiac Enzymes: No results for input(s): CKTOTAL, CKMB, CKMBINDEX, TROPONINI in the last 168 hours. BNP (last 3 results) No results for input(s): PROBNP in the last 8760 hours. HbA1C: No results for input(s): HGBA1C in the last 72 hours. CBG: No results for input(s): GLUCAP in the last 168 hours. Lipid Profile: No results for input(s): CHOL, HDL, LDLCALC, TRIG, CHOLHDL, LDLDIRECT in the last 72 hours. Thyroid Function Tests: No results for input(s): TSH, T4TOTAL, FREET4, T3FREE, THYROIDAB in the last 72 hours. Anemia Panel: No results for input(s): VITAMINB12, FOLATE, FERRITIN, TIBC, IRON, RETICCTPCT in the last 72 hours. Sepsis Labs: Recent Labs  Lab 12/13/19 1916 12/14/19 0342  LATICACIDVEN 1.8 1.7    Recent Results (from the past 240 hour(s))  COVID-19, Flu A+B and RSV ([facility])     Status: None   Collection Time: 12/11/19  6:39 PM  Result Value Ref Range Status    SARS-CoV-2, NAA Not Detected Not Detected Final    Comment: This nucleic acid amplification test was developed and its performance characteristics determined by [employer]. Nucleic acid amplification tests include RT-PCR and TMA. This test has not been FDA cleared or approved. This test has been authorized by FDA under an Emergency Use Authorization (EUA). This test is only authorized for the duration of time the declaration that circumstances exist justifying the authorization of the emergency use of in vitro diagnostic tests for detection of SARS-CoV-2 virus and/or diagnosis of COVID-19 infection under section 564(b)(1) of the Act, 21 U.S.C. 962IWL-7(L) (1), unless the authorization is terminated or revoked sooner. When diagnostic testing is negative, the possibility of a false negative result should be considered in the context of a patient's recent exposures and the presence of clinical signs and symptoms consistent with COVID-19. An individual without symptoms of COVID-19 and who is not shedding SARS-CoV-2 virus wo uld expect to have a negative (not detected) result in this assay.    Influenza A, NAA Not Detected Not Detected Final   Influenza B, NAA Not Detected Not Detected Final   RSV, NAA Not Detected Not Detected Final  Respiratory Panel by RT PCR (Flu A&B, Covid) - Nasopharyngeal Swab     Status: None   Collection Time: 12/13/19 11:04 PM   Specimen: Nasopharyngeal Swab  Result Value Ref Range Status   SARS Coronavirus 2 by RT PCR NEGATIVE NEGATIVE Final    Comment: (NOTE) SARS-CoV-2 target nucleic acids are NOT DETECTED.  The SARS-CoV-2 RNA is generally detectable in upper respiratoy specimens during the acute phase of infection. The lowest concentration of SARS-CoV-2 viral copies this assay can detect is 131 copies/mL. A negative result does not preclude SARS-Cov-2 infection and should not be used as the sole basis for treatment or other patient management  decisions. A negative result may occur with  improper specimen collection/handling, submission of specimen other than nasopharyngeal swab, presence of viral mutation(s) within the areas  targeted by this assay, and inadequate number of viral copies (<131 copies/mL). A negative result must be combined with clinical observations, patient history, and epidemiological information. The expected result is Negative.  Fact Sheet for Patients:  https://www.moore.com/  Fact Sheet for Healthcare Providers:  https://www.young.biz/  This test is no t yet approved or cleared by the [place] FDA and  has been authorized for detection and/or diagnosis of SARS-CoV-2 by FDA under an Emergency Use Authorization (EUA). This EUA will remain  in effect (meaning this test can be used) for the duration of the COVID-19 declaration under Section 564(b)(1) of the Act, 21 U.S.C. section 360bbb-3(b)(1), unless the authorization is terminated or revoked sooner.     Influenza A by PCR NEGATIVE NEGATIVE Final   Influenza B by PCR NEGATIVE NEGATIVE Final    Comment: (NOTE) The Xpert Xpress SARS-CoV-2/FLU/RSV assay is intended as an aid in  the diagnosis of influenza from Nasopharyngeal swab specimens and  should not be used as a sole basis for treatment. Nasal washings and  aspirates are unacceptable for Xpert Xpress SARS-CoV-2/FLU/RSV  testing.  Fact Sheet for Patients: https://www.moore.com/  Fact Sheet for Healthcare Providers: https://www.young.biz/  This test is not yet approved or cleared by the [place] FDA and  has been authorized for detection and/or diagnosis of SARS-CoV-2 by  FDA under an Emergency Use Authorization (EUA). This EUA will remain  in effect (meaning this test can be used) for the duration of the  Covid-19 declaration under Section 564(b)(1) of the Act, 21  U.S.C. section 360bbb-3(b)(1), unless the  authorization is  terminated or revoked. Performed at [facility], [address], [place], [place] [address]       Radiology Studies: DG Chest 2 View  Result Date: 12/13/2019 CLINICAL DATA:  Sepsis. Shortness of breath with upper abdominal pain. EXAM: CHEST - 2 VIEW COMPARISON:  December 12, 2019 FINDINGS: The heart size and mediastinal contours are within normal limits. Both lungs are clear. The visualized skeletal structures are unremarkable. IMPRESSION: No active cardiopulmonary disease. Electronically Signed   By: Katherine Mantle M.D.   On: 12/13/2019 19:35   CT Angio Chest PE W and/or Wo Contrast  Result Date: 12/14/2019 CLINICAL DATA:  Shortness of breath and upper abdominal pain x3 days. Fever but negative COVID test. EXAM: CT ANGIOGRAPHY CHEST CT ABDOMEN AND PELVIS WITH CONTRAST TECHNIQUE: Multidetector CT imaging of the chest was performed using the standard protocol during bolus administration of intravenous contrast. Multiplanar CT image reconstructions and MIPs were obtained to evaluate the vascular anatomy. Multidetector CT imaging of the abdomen and pelvis was performed using the standard protocol during bolus administration of intravenous contrast. CONTRAST:  OMNIPAQUE IOHEXOL 350 MG/ML SOLN COMPARISON:  None. FINDINGS: CTA CHEST FINDINGS Cardiovascular: Contrast injection is sufficient to demonstrate satisfactory opacification of the pulmonary arteries to the segmental level. There is no pulmonary embolus or evidence of right heart strain. The size of the main pulmonary artery is normal. Heart size is normal, with no pericardial effusion. The course and caliber of the aorta are normal. There is no atherosclerotic calcification. Opacification decreased due to pulmonary arterial phase contrast bolus timing. Mediastinum/Nodes: -- No mediastinal lymphadenopathy. -- No hilar lymphadenopathy. -- No axillary lymphadenopathy. -- No supraclavicular lymphadenopathy. -- Normal  thyroid gland where visualized. -  Unremarkable esophagus. Lungs/Pleura: There is a 5 mm pulmonary nodule in the left lower lobe (axial series 6, image 91). There is additional smaller 3-4 mm pulmonary nodule in the peripheral  left lower lobe (axial series 6, image 93). Musculoskeletal: No chest wall abnormality. No bony spinal canal stenosis. CT ABDOMEN and PELVIS FINDINGS Hepatobiliary: The liver is normal. Normal gallbladder.There is no biliary ductal dilation. Pancreas: Normal contours without ductal dilatation. No peripancreatic fluid collection. Spleen: Unremarkable. Adrenals/Urinary Tract: --Adrenal glands: Unremarkable. --Right kidney/ureter: There is a complex appearing, irregular cystic structure in the right kidney measuring approximately 1.9 cm. This structure measures approximately 36 Hounsfield units. There is no right-sided hydronephrosis. There is some mild enhancement throughout the right ureter. --Left kidney/ureter: There is a striated appearance of the left kidney without evidence for hydronephrosis. There is diffuse enhancement throughout the left ureter. --Urinary bladder: There is mild wall thickening of the urinary bladder with adjacent fat stranding. Stomach/Bowel: --Stomach/Duodenum: No hiatal hernia or other gastric abnormality. Normal duodenal course and caliber. --Small bowel: Unremarkable. --Colon: Unremarkable. --Appendix: Normal. Vascular/Lymphatic: Normal course and caliber of the major abdominal vessels. There is probable mixing artifact in the bilateral common femoral veins. --No retroperitoneal lymphadenopathy. --No mesenteric lymphadenopathy. --No pelvic or inguinal lymphadenopathy. Reproductive: There appears to be some fluid in the vaginal canal and lower uterine segment. Other: No ascites or free air. The abdominal wall is normal. Musculoskeletal. 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 Review of the MIP images confirms the above findings.  IMPRESSION: 1. No acute pulmonary embolism. 2. Striated appearance of the left kidney, concerning for pyelonephritis. 3. There is a complex appearing cystic structure in the right kidney measuring approximately 1.9 cm. This is favored to represent a proteinaceous or hemorrhagic cyst. An abscess is felt to be less likely given the lack of findings suggestive of right-sided pyelonephritis. A follow-up nonemergent renal ultrasound is recommended for further evaluation of this finding. 4. There appears to be some fluid in the vaginal canal and lower uterine segment. Correlation with patient's symptoms and menstrual cycle is recommended. 5. 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 6. Small pulmonary nodules in the left lower lobe, the largest of which measures 5 mm. No follow-up needed if patient is low-risk. Non-contrast chest CT can be considered in 12 months if patient is high-risk. This recommendation follows the consensus statement: Guidelines for Management of Incidental Pulmonary Nodules Detected on CT Images: From the Fleischner Society 2017; Radiology 2017; 284:228-243. Electronically Signed   By: Katherine Mantle M.D.   On: 12/14/2019 00:23   CT ABDOMEN PELVIS W CONTRAST  Result Date: 12/14/2019 CLINICAL DATA:  Shortness of breath and upper abdominal pain x3 days. Fever but negative COVID test. EXAM: CT ANGIOGRAPHY CHEST CT ABDOMEN AND PELVIS WITH CONTRAST TECHNIQUE: Multidetector CT imaging of the chest was performed using the standard protocol during bolus administration of intravenous contrast. Multiplanar CT image reconstructions and MIPs were obtained to evaluate the vascular anatomy. Multidetector CT imaging of the abdomen and pelvis was performed using the standard protocol during bolus administration of intravenous contrast. CONTRAST:  OMNIPAQUE IOHEXOL 350 MG/ML SOLN COMPARISON:  None. FINDINGS: CTA CHEST FINDINGS Cardiovascular: Contrast injection is sufficient to  demonstrate satisfactory opacification of the pulmonary arteries to the segmental level. There is no pulmonary embolus or evidence of right heart strain. The size of the main pulmonary artery is normal. Heart size is normal, with no pericardial effusion. The course and caliber of the aorta are normal. There is no atherosclerotic calcification. Opacification decreased due to pulmonary arterial phase contrast bolus timing. Mediastinum/Nodes: -- No mediastinal lymphadenopathy. -- No hilar lymphadenopathy. -- No axillary lymphadenopathy. -- No  supraclavicular lymphadenopathy. -- Normal thyroid gland where visualized. -  Unremarkable esophagus. Lungs/Pleura: There is a 5 mm pulmonary nodule in the left lower lobe (axial series 6, image 91). There is additional smaller 3-4 mm pulmonary nodule in the peripheral left lower lobe (axial series 6, image 93). Musculoskeletal: No chest wall abnormality. No bony spinal canal stenosis. CT ABDOMEN and PELVIS FINDINGS Hepatobiliary: The liver is normal. Normal gallbladder.There is no biliary ductal dilation. Pancreas: Normal contours without ductal dilatation. No peripancreatic fluid collection. Spleen: Unremarkable. Adrenals/Urinary Tract: --Adrenal glands: Unremarkable. --Right kidney/ureter: There is a complex appearing, irregular cystic structure in the right kidney measuring approximately 1.9 cm. This structure measures approximately 36 Hounsfield units. There is no right-sided hydronephrosis. There is some mild enhancement throughout the right ureter. --Left kidney/ureter: There is a striated appearance of the left kidney without evidence for hydronephrosis. There is diffuse enhancement throughout the left ureter. --Urinary bladder: There is mild wall thickening of the urinary bladder with adjacent fat stranding. Stomach/Bowel: --Stomach/Duodenum: No hiatal hernia or other gastric abnormality. Normal duodenal course and caliber. --Small bowel: Unremarkable. --Colon:  Unremarkable. --Appendix: Normal. Vascular/Lymphatic: Normal course and caliber of the major abdominal vessels. There is probable mixing artifact in the bilateral common femoral veins. --No retroperitoneal lymphadenopathy. --No mesenteric lymphadenopathy. --No pelvic or inguinal lymphadenopathy. Reproductive: There appears to be some fluid in the vaginal canal and lower uterine segment. Other: No ascites or free air. The abdominal wall is normal. Musculoskeletal. 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 Review of the MIP images confirms the above findings. IMPRESSION: 1. No acute pulmonary embolism. 2. Striated appearance of the left kidney, concerning for pyelonephritis. 3. There is a complex appearing cystic structure in the right kidney measuring approximately 1.9 cm. This is favored to represent a proteinaceous or hemorrhagic cyst. An abscess is felt to be less likely given the lack of findings suggestive of right-sided pyelonephritis. A follow-up nonemergent renal ultrasound is recommended for further evaluation of this finding. 4. There appears to be some fluid in the vaginal canal and lower uterine segment. Correlation with patient's symptoms and menstrual cycle is recommended. 5. 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 6. Small pulmonary nodules in the left lower lobe, the largest of which measures 5 mm. No follow-up needed if patient is low-risk. Non-contrast chest CT can be considered in 12 months if patient is high-risk. This recommendation follows the consensus statement: Guidelines for Management of Incidental Pulmonary Nodules Detected on CT Images: From the Fleischner Society 2017; Radiology 2017; 284:228-243. Electronically Signed   By: Katherine Mantlehristopher  Green M.D.   On: 12/14/2019 00:23   US RENAL  Result Date: 12/14/2019 CLINICAL DATA:  Right renal lesion EXAM: RENAL / URINARY TRACT ULTRASOUND COMPLETE COMPARISON:  CT abdomen pelvis 12/13/2019  FINDINGS: Right Kidney: Renal measurements: 10.8 x 4.0 x 5.8 cm = volume: 129 mL. 1.8 cm interpolar cyst corresponds to the lesion identified on the earlier CT. There are low level echoes within the ureter. No hydronephrosis. Left Kidney: Renal measurements: 11.2 x 5.8 x 4.8 cm = volume: 162 mL. No hydronephrosis or solid renal mass. There are low level echoes in the ureter. Bladder: Low level echoes in the bladder.  Both ureteral jets were seen. Other: None. IMPRESSION: 1. 1.8 cm interpolar right renal cyst. No solid renal mass. 2. Low-level echoes in the ureters and bladder, which may represent debris; however, this finding may also be artifactual. Correlate with urinalysis. Electronically Signed   By: Caryn BeeKevin  Chase Picket M.D.   On: 12/14/2019 03:05   DG Chest Portable 1 View  Result Date: 12/12/2019 CLINICAL DATA:  Fever, cough, short of breath EXAM: PORTABLE CHEST 1 VIEW COMPARISON:  None. FINDINGS: The heart size and mediastinal contours are within normal limits. Both lungs are clear. The visualized skeletal structures are unremarkable. IMPRESSION: No active disease. Electronically Signed   By: Sharlet Salina M.D.   On: 12/12/2019 17:49    Scheduled Meds: . enoxaparin (LOVENOX) injection  40 mg Subcutaneous Q24H  . potassium chloride  40 mEq Oral Q4H   Continuous Infusions: . sodium chloride 125 mL/hr at 12/14/19 0129  . meropenem (MERREM) IV Stopped (12/14/19 0809)     LOS: 0 days   Time spent: 40 min.   Hughie Closs, MD [facility]  12/14/2019, 9:00 AM   To contact the attending provider between 7A-7P or the covering provider during after hours 7P-7A, please log into the web site [email or web address].

## 2019-12-14 NOTE — ED Notes (Signed)
Report given to April RN

## 2019-12-14 NOTE — Progress Notes (Signed)
Patient reports new burning sensation with urination. Notified E-link and encouraging more PO fluids.

## 2019-12-14 NOTE — Progress Notes (Signed)
eLink Physician-Brief Progress Note Patient Name: Sonia Manning DOB: [birth year] MRN: [id number]   Date of Service  12/14/2019  HPI/Events of Note  Episode of emesis   eICU Interventions  Plan: 1. NPO. 2. Increase 0.9 NaCl IV infusion rate to 75 mL/hour.      Intervention Category Major Interventions: Other:  Lenell Antu 12/14/2019, 11:55 PM

## 2019-12-14 NOTE — ED Notes (Signed)
US completed

## 2019-12-14 NOTE — Progress Notes (Signed)
Pharmacy Antibiotic Note  Sonia Manning is a 24 y.o. female admitted on 12/13/2019 with sepsis due to UTI.  Pharmacy has been consulted for Merrem dosing as pt was worsening despite Rocephin.  Plan: Merrem 1g IV Q8H.  Height: 5\' 4"  (162.6 cm) Weight: 54.4 kg (120 lb) IBW/kg (Calculated) : 54.7  Temp (24hrs), Avg:101.8 F (38.8 C), Min:99 F (37.2 C), Max:104 F (40 C)  Recent Labs  Lab 12/13/19 1916 12/14/19 0342  WBC 12.2* 9.6  CREATININE 1.51* 1.20*  LATICACIDVEN 1.8 1.7    Estimated Creatinine Clearance: 62.6 mL/min (A) (by C-G formula based on SCr of 1.2 mg/dL (H)).    No Known Allergies  Thank you for allowing pharmacy to be a part of this patient's care.  12/16/19, PharmD, BCPS  12/14/2019 6:29 AM

## 2019-12-14 NOTE — Progress Notes (Signed)
Notified E-link patient had episode of bilious/brown emesis that she reports was rapid onset and without having food/medication immediately before epsidoe. PRN zofran given, per e-link maintain clear liquids for duration of night and check blood glucose.

## 2019-12-14 NOTE — ED Notes (Signed)
Mother Brenee Gajda was updated

## 2019-12-14 NOTE — Consult Note (Signed)
NAME:  Sonia Manning, MRN:  [id number], DOB:  1995-10-22, LOS: 0 ADMISSION DATE:  12/13/2019, CONSULTATION DATE: 12/14/2019 REFERRING MD: Dr. Leonides Cave, CHIEF COMPLAINT: Septic shock  Brief History   24 year old female, denies other significant past medical history presented with fever right flank pain and suprapubic abdominal pain.  Patient found to have urinary tract infection concern for pyelonephritis and sepsis patient was started on antimicrobials and fluid resuscitation despite adequate fluid resuscitation patient developed hypotension and was started on norepinephrine.  Critical care was consulted recommendations management.  History of present illness   This is a 24 year old female, no significant past medical history, developed fever right flank pain and suprapubic abdominal pain diagnosed with urine tract infection concern for pyelonephritis.  Patient was started on anticoagulant emerge department status post 4 L fluid resuscitation, blood pressure was not responsive to fluid resuscitation patient was started on norepinephrine and critical care was consulted for recommendation management regarding septic shock.  Patient was evaluated in the ICU.  She states that she had developed body aches and ongoing discomfort for 3 days prior to presentation to the ER.  Since she developed high fevers and worsening flank pain decided to return to the emergency room for evaluation.  Of note she was also found to have an elevated serum creatinine and initial white blood cell count of 12,000.  Past Medical History  History reviewed. No pertinent past medical history.   Significant Hospital Events   ICU admission  Consults:  Pulmonary critical care  Procedures:  Peripheral IVs  Significant Diagnostic Tests:  CT scan abdomen and pelvis: Concern for right-sided pyelonephritis.  Micro Data:  COVID-19 negative Blood cultures:, no growth to date Urine culture in process  Antimicrobials:    Initially received ceftriaxone plus vancomycin Changed to meropenem on admission.  Interim history/subjective:  Per HPI above, does complain of suprapubic abdominal pain right flank pain, febrile 103  Objective   Blood pressure (!) 80/54, pulse (!) 116, temperature (!) 103 F (39.4 C), temperature source Oral, resp. rate 16, height 5\' 4"  (1.626 m), weight 54.4 kg, last menstrual period 12/03/2019, SpO2 99 %.        Intake/Output Summary (Last 24 hours) at 12/14/2019 1053 Last data filed at 12/14/2019 0128 Gross per 24 hour  Intake 1900 ml  Output --  Net 1900 ml   Filed Weights   12/13/19 1902 12/14/19 0336  Weight: 54.4 kg 54.4 kg    Examination: General: Is a young female, resting in bed, ill-appearing HENT: NCAT, tracking appropriately Lungs: Clear to auscultation bilaterally, no crackles no wheeze Cardiovascular: Tachycardic, regular S1-S2 Abdomen: Pain with palpation suprapubically and pain in the right flank Extremities: No significant edema, no rash Neuro: Alert oriented following commands no focal deficit GU: Deferred  Resolved Hospital Problem list     Assessment & Plan:   Septic shock secondary to acute pyelonephritis, urinary tract infection, present on admission Plan: Status post adequate fluid resuscitation, x4 L. Initiation of peripheral norepinephrine infusion Continue to observe in the ICU and wean off of norepinephrine to maintain mean arterial pressure greater than 65 mmHg. Would have preferred to avoid any invasive blood pressure monitoring needs. Continue to observe cuff pressures. She still just requiring low-dose vasopressor infusion at this time. Continue antimicrobials, meropenem started already, pending culture can likely de-escalate soon.  AKI Plan: Presumptively prerenal and sepsis related AKI. Continue to observe with repeat basic metabolic panel and follow urine output.  Flank pain, nausea Plan: Continue as needed Zofran and  as  needed opiates for pain management.   Best practice:  Diet: advance diet as tolerated  Pain/Anxiety/Delirium protocol (if indicated): prn dialudid for pain  VAP protocol (if indicated): na DVT prophylaxis: lovenox GI prophylaxis: na Glucose control: na Mobility: BR Code Status: FULL  Family Communication: I called and spoke with mother Sheralyn Boatman  Disposition:   Labs   CBC: Recent Labs  Lab 12/13/19 1916 12/14/19 0342  WBC 12.2* 9.6  NEUTROABS 9.8* 7.9*  HGB 12.2 9.7*  HCT 35.7* 29.2*  MCV 86.0 89.3  PLT 148* 105*    Basic Metabolic Panel: Recent Labs  Lab 12/13/19 1916 12/14/19 0342  NA 132* 136  K 3.5 3.2*  CL 97* 103  CO2 22 22  GLUCOSE 109* 117*  BUN 16 13  CREATININE 1.51* 1.20*  CALCIUM 9.1 8.0*   GFR: Estimated Creatinine Clearance: 62.6 mL/min (A) (by C-G formula based on SCr of 1.2 mg/dL (H)). Recent Labs  Lab 12/13/19 1916 12/14/19 0342  WBC 12.2* 9.6  LATICACIDVEN 1.8 1.7    Liver Function Tests: Recent Labs  Lab 12/13/19 1916  AST 19  ALT 14  ALKPHOS 57  BILITOT 1.2  PROT 7.2  ALBUMIN 3.6   Recent Labs  Lab 12/13/19 2051  LIPASE 28   No results for input(s): AMMONIA in the last 168 hours.  ABG No results found for: PHART, PCO2ART, PO2ART, HCO3, TCO2, ACIDBASEDEF, O2SAT   Coagulation Profile: Recent Labs  Lab 12/13/19 1916 12/14/19 0706  INR 1.1 1.4*    Cardiac Enzymes: No results for input(s): CKTOTAL, CKMB, CKMBINDEX, TROPONINI in the last 168 hours.  HbA1C: No results found for: HGBA1C  CBG: No results for input(s): GLUCAP in the last 168 hours.  Review of Systems:   Review of Systems  Constitutional: Negative for chills, fever, malaise/fatigue and weight loss.  HENT: Negative for hearing loss, sore throat and tinnitus.   Eyes: Negative for blurred vision and double vision.  Respiratory: Negative for cough, hemoptysis, sputum production, shortness of breath, wheezing and stridor.   Cardiovascular: Negative for  chest pain, palpitations, orthopnea, leg swelling and PND.  Gastrointestinal: Positive for abdominal pain and nausea. Negative for constipation, diarrhea, heartburn and vomiting.  Genitourinary: Positive for dysuria and flank pain. Negative for hematuria and urgency.  Musculoskeletal: Negative for joint pain and myalgias.  Skin: Negative for itching and rash.  Neurological: Negative for dizziness, tingling, weakness and headaches.  Endo/Heme/Allergies: Negative for environmental allergies. Does not bruise/bleed easily.  Psychiatric/Behavioral: Negative for depression. The patient is not nervous/anxious and does not have insomnia.   All other systems reviewed and are negative.    Past Medical History  She,  has no past medical history on file.   Surgical History    Past Surgical History:  Procedure Laterality Date  . TONSILLECTOMY       Social History   reports that she has never smoked. She has never used smokeless tobacco. She reports that she does not drink alcohol and does not use drugs.   Family History   Her family history includes Healthy in her brother, mother, sister, and sister; Hyperlipidemia in her father.   Allergies No Known Allergies   Home Medications  Prior to Admission medications   Medication Sig Start Date End Date Taking? Authorizing Provider  benzonatate (TESSALON) 100 MG capsule Take 1 capsule (100 mg total) by mouth every 8 (eight) hours. 12/11/19  Yes Wurst, [place], PA-C  ibuprofen (ADVIL) 200 MG tablet Take 800 mg by mouth  every 6 (six) hours as needed for moderate pain.   Yes [provider]  fluticasone (FLONASE) 50 MCG/ACT nasal spray Place 2 sprays into both nostrils daily. Patient not taking: Reported on 12/14/2019 04/16/18   Sonny Masters, FNP   This patient is critically ill with multiple organ system failure; which, requires frequent high complexity decision making, assessment, support, evaluation, and titration of therapies. This was  completed through the application of advanced monitoring technologies and extensive interpretation of multiple databases. During this encounter critical care time was devoted to patient care services described in this note for 32 minutes.  Josephine Igo, DO [facility] 12/14/2019 11:07 AM

## 2019-12-14 NOTE — H&P (Addendum)
History and Physical    Sonia Manning ZOX:[id number] DOB: 1995/03/08 DOA: 12/13/2019  PCP: Dettinger, Elige RadonJoshua A, MD   Patient coming from: Home   Chief Complaint: Fever, non-productive cough, right flank pain, suprapubic discomfort   HPI: Sonia Dameyden Shearman is a 24 y.o. female who denies any significant past medical history now presents emergency department for evaluation of fevers, cough, aches, right flank pain, and suprapubic discomfort.  Patient has had several days of nonproductive cough and then developed fevers, flank pain, and suprapubic discomfort 3 days ago.  She has not been particularly short of breath and denied dysuria or gross hematuria.  She had negative COVID-19, RSV, and influenza testing on 12/11/2019 and was seen in the [facility] emergency department on 12/12/2019 with negative chest x-ray and stable vital signs.  She has since developed worsening flank pain, continues to have high fevers, and returns to the ED tonight for that reason.  ED Course: Upon arrival to the ED, patient is found to be febrile to 40 C, saturating mid 90s on room air, tachycardic to 140, and with systolic blood pressure in the 90s.  EKG features a sinus rhythm and chest x-rays negative for acute cardiopulmonary disease.  CTA chest is negative for PE but notable for lung nodules.  CT of the abdomen and pelvis features a complex cystic structure involving the right kidney measuring 1.9 cm, as well as urinary bladder wall thickening and fat stranding.  Chemistry panel with sodium 132 and creatinine 1.51.  CBC is notable for leukocytosis to 12,200 and a slight thrombocytopenia.  Lactic acid is reassuringly normal.  Blood cultures were collected in the emergency department and the patient was treated with 3 L of IV fluids and 2 g IV Rocephin.  COVID-19 and influenza testing is negative.  Review of Systems:  All other systems reviewed and apart from HPI, are negative.  History reviewed. No pertinent past medical  history.  Past Surgical History:  Procedure Laterality Date  . TONSILLECTOMY      Social History:   reports that she has never smoked. She has never used smokeless tobacco. She reports that she does not drink alcohol and does not use drugs.  No Known Allergies  Family History  Problem Relation Age of Onset  . Healthy Mother   . Hyperlipidemia Father   . Healthy Sister   . Healthy Brother   . Healthy Sister      Prior to Admission medications   Medication Sig Start Date End Date Taking? Authorizing Provider  benzonatate (TESSALON) 100 MG capsule Take 1 capsule (100 mg total) by mouth every 8 (eight) hours. 12/11/19   Wurst, Gren[place]itt[place], PA-C  doxycycline (VIBRA-TABS) 100 MG tablet Take 1 tablet (100 mg total) by mouth 2 (two) times daily. 1 po bid 04/18/18   Daphine DeutscherMartin, Mary-Margaret, FNP  fluticasone (FLONASE) 50 MCG/ACT nasal spray Place 2 sprays into both nostrils daily. 04/16/18   Sonny Mastersakes, Linda M, FNP    Physical Exam: Vitals:   12/13/19 2300 12/14/19 0002 12/14/19 0045 12/14/19 0110  BP: 95/62 (!) 110/100 (!) 92/58 90/60  Pulse: 96 91 86 85  Resp: 15 11 15 14   Temp:      TempSrc:      SpO2: 94% 99% 95% 98%  Weight:      Height:        Constitutional: NAD, calm  Eyes: PERTLA, lids and conjunctivae normal ENMT: Mucous membranes are moist. Posterior pharynx clear of any exudate or lesions.   Neck: normal,  supple, no masses, no thyromegaly Respiratory: no wheezing, no crackles. No accessory muscle use.  Cardiovascular: S1 & S2 heard, regular rate and rhythm. No extremity edema.   Abdomen: No distension, soft, suprapubic and right flank tenderness. Bowel sounds active.  Musculoskeletal: no clubbing / cyanosis. No joint deformity upper and lower extremities.   Skin: no significant rashes, lesions, ulcers. Warm, dry, well-perfused. Neurologic: No gross facial asymmetry. Sensation intact. Moving all extremities.  Psychiatric: Alert and oriented to person, place, and situation.  Calm and cooperative.    Labs and Imaging on Admission: I have personally reviewed following labs and imaging studies  CBC: Recent Labs  Lab 12/13/19 1916  WBC 12.2*  NEUTROABS 9.8*  HGB 12.2  HCT 35.7*  MCV 86.0  PLT 148*   Basic Metabolic Panel: Recent Labs  Lab 12/13/19 1916  NA 132*  K 3.5  CL 97*  CO2 22  GLUCOSE 109*  BUN 16  CREATININE 1.51*  CALCIUM 9.1   GFR: Estimated Creatinine Clearance: 49.8 mL/min (A) (by C-G formula based on SCr of 1.51 mg/dL (H)). Liver Function Tests: Recent Labs  Lab 12/13/19 1916  AST 19  ALT 14  ALKPHOS 57  BILITOT 1.2  PROT 7.2  ALBUMIN 3.6   Recent Labs  Lab 12/13/19 2051  LIPASE 28   No results for input(s): AMMONIA in the last 168 hours. Coagulation Profile: Recent Labs  Lab 12/13/19 1916  INR 1.1   Cardiac Enzymes: No results for input(s): CKTOTAL, CKMB, CKMBINDEX, TROPONINI in the last 168 hours. BNP (last 3 results) No results for input(s): PROBNP in the last 8760 hours. HbA1C: No results for input(s): HGBA1C in the last 72 hours. CBG: No results for input(s): GLUCAP in the last 168 hours. Lipid Profile: No results for input(s): CHOL, HDL, LDLCALC, TRIG, CHOLHDL, LDLDIRECT in the last 72 hours. Thyroid Function Tests: No results for input(s): TSH, T4TOTAL, FREET4, T3FREE, THYROIDAB in the last 72 hours. Anemia Panel: No results for input(s): VITAMINB12, FOLATE, FERRITIN, TIBC, IRON, RETICCTPCT in the last 72 hours. Urine analysis:    Component Value Date/Time   COLORURINE YELLOW 12/13/2019 2229   APPEARANCEUR CLOUDY (A) 12/13/2019 2229   LABSPEC 1.009 12/13/2019 2229   PHURINE 5.0 12/13/2019 2229   GLUCOSEU NEGATIVE 12/13/2019 2229   HGBUR MODERATE (A) 12/13/2019 2229   BILIRUBINUR NEGATIVE 12/13/2019 2229   BILIRUBINUR neg 09/04/2013 1810   KETONESUR 5 (A) 12/13/2019 2229   PROTEINUR 100 (A) 12/13/2019 2229   UROBILINOGEN negative 09/04/2013 1810   NITRITE NEGATIVE 12/13/2019 2229    LEUKOCYTESUR LARGE (A) 12/13/2019 2229   Sepsis Labs: (procalcitonin:4,lacticidven:4) ) Recent Results (from the past 240 hour(s))  COVID-19, Flu A+B and RSV ([facility])     Status: None   Collection Time: 12/11/19  6:39 PM  Result Value Ref Range Status   SARS-CoV-2, NAA Not Detected Not Detected Final    Comment: This nucleic acid amplification test was developed and its performance characteristics determined by [employer]. Nucleic acid amplification tests include RT-PCR and TMA. This test has not been FDA cleared or approved. This test has been authorized by FDA under an Emergency Use Authorization (EUA). This test is only authorized for the duration of time the declaration that circumstances exist justifying the authorization of the emergency use of in vitro diagnostic tests for detection of SARS-CoV-2 virus and/or diagnosis of COVID-19 infection under section 564(b)(1) of the Act, 21 U.S.C. 098JXB-1(Y) (1), unless the authorization is terminated or revoked sooner. When diagnostic testing is  negative, the possibility of a false negative result should be considered in the context of a patient's recent exposures and the presence of clinical signs and symptoms consistent with COVID-19. An individual without symptoms of COVID-19 and who is not shedding SARS-CoV-2 virus wo uld expect to have a negative (not detected) result in this assay.    Influenza A, NAA Not Detected Not Detected Final   Influenza B, NAA Not Detected Not Detected Final   RSV, NAA Not Detected Not Detected Final  Respiratory Panel by RT PCR (Flu A&B, Covid) - Nasopharyngeal Swab     Status: None   Collection Time: 12/13/19 11:04 PM   Specimen: Nasopharyngeal Swab  Result Value Ref Range Status   SARS Coronavirus 2 by RT PCR NEGATIVE NEGATIVE Final    Comment: (NOTE) SARS-CoV-2 target nucleic acids are NOT DETECTED.  The SARS-CoV-2 RNA is generally detectable in upper respiratoy specimens  during the acute phase of infection. The lowest concentration of SARS-CoV-2 viral copies this assay can detect is 131 copies/mL. A negative result does not preclude SARS-Cov-2 infection and should not be used as the sole basis for treatment or other patient management decisions. A negative result may occur with  improper specimen collection/handling, submission of specimen other than nasopharyngeal swab, presence of viral mutation(s) within the areas targeted by this assay, and inadequate number of viral copies (<131 copies/mL). A negative result must be combined with clinical observations, patient history, and epidemiological information. The expected result is Negative.  Fact Sheet for Patients:  https://www.moore.com/  Fact Sheet for Healthcare Providers:  https://www.young.biz/  This test is no t yet approved or cleared by the [place] FDA and  has been authorized for detection and/or diagnosis of SARS-CoV-2 by FDA under an Emergency Use Authorization (EUA). This EUA will remain  in effect (meaning this test can be used) for the duration of the COVID-19 declaration under Section 564(b)(1) of the Act, 21 U.S.C. section 360bbb-3(b)(1), unless the authorization is terminated or revoked sooner.     Influenza A by PCR NEGATIVE NEGATIVE Final   Influenza B by PCR NEGATIVE NEGATIVE Final    Comment: (NOTE) The Xpert Xpress SARS-CoV-2/FLU/RSV assay is intended as an aid in  the diagnosis of influenza from Nasopharyngeal swab specimens and  should not be used as a sole basis for treatment. Nasal washings and  aspirates are unacceptable for Xpert Xpress SARS-CoV-2/FLU/RSV  testing.  Fact Sheet for Patients: https://www.moore.com/  Fact Sheet for Healthcare Providers: https://www.young.biz/  This test is not yet approved or cleared by the [place] FDA and  has been authorized for detection and/or  diagnosis of SARS-CoV-2 by  FDA under an Emergency Use Authorization (EUA). This EUA will remain  in effect (meaning this test can be used) for the duration of the  Covid-19 declaration under Section 564(b)(1) of the Act, 21  U.S.C. section 360bbb-3(b)(1), unless the authorization is  terminated or revoked. Performed at [facility], [address], [place], [place] [address]      Radiological Exams on Admission: DG Chest 2 View  Result Date: 12/13/2019 CLINICAL DATA:  Sepsis. Shortness of breath with upper abdominal pain. EXAM: CHEST - 2 VIEW COMPARISON:  December 12, 2019 FINDINGS: The heart size and mediastinal contours are within normal limits. Both lungs are clear. The visualized skeletal structures are unremarkable. IMPRESSION: No active cardiopulmonary disease. Electronically Signed   By: Katherine Mantle M.D.   On: 12/13/2019 19:35   CT Angio Chest PE W and/or  Wo Contrast  Result Date: 12/14/2019 CLINICAL DATA:  Shortness of breath and upper abdominal pain x3 days. Fever but negative COVID test. EXAM: CT ANGIOGRAPHY CHEST CT ABDOMEN AND PELVIS WITH CONTRAST TECHNIQUE: Multidetector CT imaging of the chest was performed using the standard protocol during bolus administration of intravenous contrast. Multiplanar CT image reconstructions and MIPs were obtained to evaluate the vascular anatomy. Multidetector CT imaging of the abdomen and pelvis was performed using the standard protocol during bolus administration of intravenous contrast. CONTRAST:  OMNIPAQUE IOHEXOL 350 MG/ML SOLN COMPARISON:  None. FINDINGS: CTA CHEST FINDINGS Cardiovascular: Contrast injection is sufficient to demonstrate satisfactory opacification of the pulmonary arteries to the segmental level. There is no pulmonary embolus or evidence of right heart strain. The size of the main pulmonary artery is normal. Heart size is normal, with no pericardial effusion. The course and caliber of the aorta are normal.  There is no atherosclerotic calcification. Opacification decreased due to pulmonary arterial phase contrast bolus timing. Mediastinum/Nodes: -- No mediastinal lymphadenopathy. -- No hilar lymphadenopathy. -- No axillary lymphadenopathy. -- No supraclavicular lymphadenopathy. -- Normal thyroid gland where visualized. -  Unremarkable esophagus. Lungs/Pleura: There is a 5 mm pulmonary nodule in the left lower lobe (axial series 6, image 91). There is additional smaller 3-4 mm pulmonary nodule in the peripheral left lower lobe (axial series 6, image 93). Musculoskeletal: No chest wall abnormality. No bony spinal canal stenosis. CT ABDOMEN and PELVIS FINDINGS Hepatobiliary: The liver is normal. Normal gallbladder.There is no biliary ductal dilation. Pancreas: Normal contours without ductal dilatation. No peripancreatic fluid collection. Spleen: Unremarkable. Adrenals/Urinary Tract: --Adrenal glands: Unremarkable. --Right kidney/ureter: There is a complex appearing, irregular cystic structure in the right kidney measuring approximately 1.9 cm. This structure measures approximately 36 Hounsfield units. There is no right-sided hydronephrosis. There is some mild enhancement throughout the right ureter. --Left kidney/ureter: There is a striated appearance of the left kidney without evidence for hydronephrosis. There is diffuse enhancement throughout the left ureter. --Urinary bladder: There is mild wall thickening of the urinary bladder with adjacent fat stranding. Stomach/Bowel: --Stomach/Duodenum: No hiatal hernia or other gastric abnormality. Normal duodenal course and caliber. --Small bowel: Unremarkable. --Colon: Unremarkable. --Appendix: Normal. Vascular/Lymphatic: Normal course and caliber of the major abdominal vessels. There is probable mixing artifact in the bilateral common femoral veins. --No retroperitoneal lymphadenopathy. --No mesenteric lymphadenopathy. --No pelvic or inguinal lymphadenopathy. Reproductive:  There appears to be some fluid in the vaginal canal and lower uterine segment. Other: No ascites or free air. The abdominal wall is normal. Musculoskeletal. 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 Review of the MIP images confirms the above findings. IMPRESSION: 1. No acute pulmonary embolism. 2. Striated appearance of the left kidney, concerning for pyelonephritis. 3. There is a complex appearing cystic structure in the right kidney measuring approximately 1.9 cm. This is favored to represent a proteinaceous or hemorrhagic cyst. An abscess is felt to be less likely given the lack of findings suggestive of right-sided pyelonephritis. A follow-up nonemergent renal ultrasound is recommended for further evaluation of this finding. 4. There appears to be some fluid in the vaginal canal and lower uterine segment. Correlation with patient's symptoms and menstrual cycle is recommended. 5. 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 6. Small pulmonary nodules in the left lower lobe, the largest of which measures 5 mm. No follow-up needed if patient is low-risk. Non-contrast chest CT can be considered in 12 months if patient is high-risk. This recommendation follows  the consensus statement: Guidelines for Management of Incidental Pulmonary Nodules Detected on CT Images: From the Fleischner Society 2017; Radiology 2017; 284:228-243. Electronically Signed   By: Katherine Mantle M.D.   On: 12/14/2019 00:23   CT ABDOMEN PELVIS W CONTRAST  Result Date: 12/14/2019 CLINICAL DATA:  Shortness of breath and upper abdominal pain x3 days. Fever but negative COVID test. EXAM: CT ANGIOGRAPHY CHEST CT ABDOMEN AND PELVIS WITH CONTRAST TECHNIQUE: Multidetector CT imaging of the chest was performed using the standard protocol during bolus administration of intravenous contrast. Multiplanar CT image reconstructions and MIPs were obtained to evaluate the vascular anatomy. Multidetector CT  imaging of the abdomen and pelvis was performed using the standard protocol during bolus administration of intravenous contrast. CONTRAST:  OMNIPAQUE IOHEXOL 350 MG/ML SOLN COMPARISON:  None. FINDINGS: CTA CHEST FINDINGS Cardiovascular: Contrast injection is sufficient to demonstrate satisfactory opacification of the pulmonary arteries to the segmental level. There is no pulmonary embolus or evidence of right heart strain. The size of the main pulmonary artery is normal. Heart size is normal, with no pericardial effusion. The course and caliber of the aorta are normal. There is no atherosclerotic calcification. Opacification decreased due to pulmonary arterial phase contrast bolus timing. Mediastinum/Nodes: -- No mediastinal lymphadenopathy. -- No hilar lymphadenopathy. -- No axillary lymphadenopathy. -- No supraclavicular lymphadenopathy. -- Normal thyroid gland where visualized. -  Unremarkable esophagus. Lungs/Pleura: There is a 5 mm pulmonary nodule in the left lower lobe (axial series 6, image 91). There is additional smaller 3-4 mm pulmonary nodule in the peripheral left lower lobe (axial series 6, image 93). Musculoskeletal: No chest wall abnormality. No bony spinal canal stenosis. CT ABDOMEN and PELVIS FINDINGS Hepatobiliary: The liver is normal. Normal gallbladder.There is no biliary ductal dilation. Pancreas: Normal contours without ductal dilatation. No peripancreatic fluid collection. Spleen: Unremarkable. Adrenals/Urinary Tract: --Adrenal glands: Unremarkable. --Right kidney/ureter: There is a complex appearing, irregular cystic structure in the right kidney measuring approximately 1.9 cm. This structure measures approximately 36 Hounsfield units. There is no right-sided hydronephrosis. There is some mild enhancement throughout the right ureter. --Left kidney/ureter: There is a striated appearance of the left kidney without evidence for hydronephrosis. There is diffuse enhancement throughout the  left ureter. --Urinary bladder: There is mild wall thickening of the urinary bladder with adjacent fat stranding. Stomach/Bowel: --Stomach/Duodenum: No hiatal hernia or other gastric abnormality. Normal duodenal course and caliber. --Small bowel: Unremarkable. --Colon: Unremarkable. --Appendix: Normal. Vascular/Lymphatic: Normal course and caliber of the major abdominal vessels. There is probable mixing artifact in the bilateral common femoral veins. --No retroperitoneal lymphadenopathy. --No mesenteric lymphadenopathy. --No pelvic or inguinal lymphadenopathy. Reproductive: There appears to be some fluid in the vaginal canal and lower uterine segment. Other: No ascites or free air. The abdominal wall is normal. Musculoskeletal. 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 Review of the MIP images confirms the above findings. IMPRESSION: 1. No acute pulmonary embolism. 2. Striated appearance of the left kidney, concerning for pyelonephritis. 3. There is a complex appearing cystic structure in the right kidney measuring approximately 1.9 cm. This is favored to represent a proteinaceous or hemorrhagic cyst. An abscess is felt to be less likely given the lack of findings suggestive of right-sided pyelonephritis. A follow-up nonemergent renal ultrasound is recommended for further evaluation of this finding. 4. There appears to be some fluid in the vaginal canal and lower uterine segment. Correlation with patient's symptoms and menstrual cycle is recommended. 5. Subtle avascular necrosis of the bilateral  femoral heads. 6. Small pulmonary nodules in the left lower lobe, the largest of which measures 5 mm. No follow-up needed if patient is low-risk. Non-contrast chest CT can be considered in 12 months if patient is high-risk. This recommendation follows the consensus statement: Guidelines for Management of Incidental Pulmonary Nodules Detected on CT Images: From the Fleischner Society 2017;  Radiology 2017; 284:228-243. Electronically Signed   By: Katherine Mantle M.D.   On: 12/14/2019 00:23   DG Chest Portable 1 View  Result Date: 12/12/2019 CLINICAL DATA:  Fever, cough, short of breath EXAM: PORTABLE CHEST 1 VIEW COMPARISON:  None. FINDINGS: The heart size and mediastinal contours are within normal limits. Both lungs are clear. The visualized skeletal structures are unremarkable. IMPRESSION: No active disease. Electronically Signed   By: Sharlet Salina M.D.   On: 12/12/2019 17:49    EKG: Independently reviewed. Sinus rhythm.   Assessment/Plan   1. Severe sepsis secondary to UTI  - Presents with 3 days of fevers, right flank pain, and suprapubic discomfort and is found to have UTI with fever, leukocytosis, tachycardia, and renal insufficiency with normal lactate and stable BP  - Blood cultures collected in ED, urine culture to be added-on, >30 cc/kg IVF bolus was given, and she was started on antibiotics  - Continue Rocephin, follow cultures and clinical course    ADDENDUM: SBP in 80s and HR back up this am. She has also developed anemia and thrombocytopenia. No culture data yet. Giving additional fluid bolus, checking DIC panel. Discussed case with PCCM, appreciate input, will broaden antibiotics as well.    2. Renal insufficiency; right kidney cyst vs abscess  - SCr is 1.51 on admission; She denies hx of CKD; no prior labs available for comparison, likely prerenal azotemia in setting of sepsis - There is a 1.9 cm hemorrhagic cyst vs abscess involving right kidney on CT abd/pelvis; continue antibiotics as above, follow-up with non-urgent renal US   3. Hyponatremia  - Serum sodium is 132 on admission in setting of hypovolemia  - She has been fluid-resuscitated in ED, will repeat chem panel in am    4. Lung nodule  - Left lung nodules noted incidentally on CT in ED  - Findings discussed with patient, outpatient follow-up recommended     DVT prophylaxis: Lovenox  Code  Status: Full  Family Communication: Discussed with patient  Disposition Plan:  Patient is from: Home  Anticipated d/c is to: Home  Anticipated d/c date is: 12/15/19 Patient currently: Pending improvement in sepsis, renal insufficiency  Consults called: None  Admission status: Inpatient     Briscoe Deutscher, MD [facility]  12/14/2019, 1:29 AM

## 2019-12-14 NOTE — ED Notes (Signed)
MD made aware of BP being 84/55

## 2019-12-15 DIAGNOSIS — A419 Sepsis, unspecified organism: Secondary | ICD-10-CM

## 2019-12-15 LAB — CBC WITH DIFFERENTIAL/PLATELET
Abs Immature Granulocytes: 0.16 10*3/uL — ABNORMAL HIGH (ref 0.00–0.07)
Basophils Absolute: 0.1 10*3/uL (ref 0.0–0.1)
Basophils Relative: 1 %
Eosinophils Absolute: 0 10*3/uL (ref 0.0–0.5)
Eosinophils Relative: 0 %
HCT: 27 % — ABNORMAL LOW (ref 36.0–46.0)
Hemoglobin: 9 g/dL — ABNORMAL LOW (ref 12.0–15.0)
Immature Granulocytes: 1 %
Lymphocytes Relative: 10 %
Lymphs Abs: 1.1 10*3/uL (ref 0.7–4.0)
MCH: 29.3 pg (ref 26.0–34.0)
MCHC: 33.3 g/dL (ref 30.0–36.0)
MCV: 87.9 fL (ref 80.0–100.0)
Monocytes Absolute: 2 10*3/uL — ABNORMAL HIGH (ref 0.1–1.0)
Monocytes Relative: 17 %
Neutro Abs: 8.1 10*3/uL — ABNORMAL HIGH (ref 1.7–7.7)
Neutrophils Relative %: 71 %
Platelets: 110 10*3/uL — ABNORMAL LOW (ref 150–400)
RBC: 3.07 MIL/uL — ABNORMAL LOW (ref 3.87–5.11)
RDW: 14.3 % (ref 11.5–15.5)
WBC: 11.4 10*3/uL — ABNORMAL HIGH (ref 4.0–10.5)
nRBC: 0 % (ref 0.0–0.2)

## 2019-12-15 LAB — FACTOR 8 ASSAY: Coagulation Factor VIII: 229 % — ABNORMAL HIGH (ref 56–140)

## 2019-12-15 LAB — BASIC METABOLIC PANEL
Anion gap: 6 (ref 5–15)
Anion gap: 10 (ref 5–15)
BUN: 7 mg/dL (ref 6–20)
BUN: 7 mg/dL (ref 6–20)
CO2: 21 mmol/L — ABNORMAL LOW (ref 22–32)
CO2: 20 mmol/L — ABNORMAL LOW (ref 22–32)
Calcium: 7.9 mg/dL — ABNORMAL LOW (ref 8.9–10.3)
Calcium: 8.4 mg/dL — ABNORMAL LOW (ref 8.9–10.3)
Chloride: 105 mmol/L (ref 98–111)
Chloride: 104 mmol/L (ref 98–111)
Creatinine, Ser: 0.96 mg/dL (ref 0.44–1.00)
Creatinine, Ser: 0.9 mg/dL (ref 0.44–1.00)
GFR, Estimated: >60 mL/min (ref >60)
GFR, Estimated: >60 mL/min (ref >60)
Glucose, Bld: 120 mg/dL — ABNORMAL HIGH (ref 70–99)
Glucose, Bld: 88 mg/dL (ref 70–99)
Potassium: 4.1 mmol/L (ref 3.5–5.1)
Potassium: 4.7 mmol/L (ref 3.5–5.1)
Sodium: 132 mmol/L — ABNORMAL LOW (ref 135–145)
Sodium: 134 mmol/L — ABNORMAL LOW (ref 135–145)

## 2019-12-15 LAB — HEPATIC FUNCTION PANEL
ALT: 16 U/L (ref 0–44)
AST: 20 U/L (ref 15–41)
Albumin: 2.4 g/dL — ABNORMAL LOW (ref 3.5–5.0)
Alkaline Phosphatase: 38 U/L (ref 38–126)
Bilirubin, Direct: 0.2 mg/dL (ref 0.0–0.2)
Indirect Bilirubin: 0.5 mg/dL (ref 0.3–0.9)
Total Bilirubin: 0.7 mg/dL (ref 0.3–1.2)
Total Protein: 5.2 g/dL — ABNORMAL LOW (ref 6.5–8.1)

## 2019-12-15 LAB — MAGNESIUM: Magnesium: 1.7 mg/dL (ref 1.7–2.4)

## 2019-12-15 LAB — FACTOR 5 ASSAY: Factor V Activity: 91 % (ref 70–150)

## 2019-12-15 MED ORDER — SODIUM CHLORIDE 0.9 % IV SOLN
500.0000 mg | INTRAVENOUS | Status: DC
  Administered 2019-12-15 – 2019-12-17 (×3): 500 mg via INTRAVENOUS
  Filled 2019-12-15 (×5): qty 500

## 2019-12-15 MED ORDER — OXYMETAZOLINE HCL 0.05 % NA SOLN
2.0000 | NASAL | Status: DC | PRN
  Filled 2019-12-15: qty 30

## 2019-12-15 MED ORDER — LACTATED RINGERS IV BOLUS
1000.0000 mL | Freq: Once | INTRAVENOUS | Status: AC
  Administered 2019-12-15: 1000 mL via INTRAVENOUS

## 2019-12-15 MED ORDER — TRANEXAMIC ACID FOR INHALATION
500.0000 mg | Freq: Once | RESPIRATORY_TRACT | Status: AC
  Administered 2019-12-15: 500 mg via RESPIRATORY_TRACT
  Filled 2019-12-15: qty 10

## 2019-12-15 MED ORDER — MAGNESIUM SULFATE 2 GM/50ML IV SOLN
2.0000 g | Freq: Once | INTRAVENOUS | Status: AC
  Administered 2019-12-15: 2 g via INTRAVENOUS
  Filled 2019-12-15: qty 50

## 2019-12-15 MED ORDER — SODIUM CHLORIDE 0.9 % IV SOLN: 500.0000 mg | INTRAVENOUS | Status: DC

## 2019-12-15 NOTE — Progress Notes (Signed)
[facility] ADULT ICU REPLACEMENT PROTOCOL   The patient does apply for the [facility] Adult ICU Electrolyte Replacment Protocol based on the criteria listed below:   1. Is GFR >/= 30 ml/min? Yes.    Patient's GFR today is >60 2. Is SCr </= 2? Yes.   Patient's SCr is 0.96 ml/kg/hr 3. Did SCr increase >/= 0.5 in 24 hours? No. 4. Abnormal electrolyte(s): Mag 1.7  5. Ordered repletion with: protocol 6. If a panic level lab has been reported, has the CCM MD in charge been notified? Yes.  .   Physician:  Dr.Sommer  Lolita Lenz 12/15/2019 5:11 AM

## 2019-12-15 NOTE — Consult Note (Addendum)
ENT CONSULT:  Reason for Consult: Epistaxis  Referring Physician:  Dr. Merrily Pew   HPI: Sonia Manning is an 24 y.o. female with no significant past medical history who presented with fever, right flank pain, suprapubic abdominal pain on 11/13/2019.  Patient was found to have UTI with concern for pyelonephritis and sepsis and was admitted for further management.  Patient reports that since admission, she has had 3 episodes of epistaxis, most recently today the episode lasted for approximately 25 minutes before bleeding spontaneously stopped.  She states it has been primarily left-sided.  She denies prior history of epistaxis.  She is currently on Lovenox for DVT prophylaxis but is not any other blood thinners.  She denies recent history of nasal trauma.    History reviewed. No pertinent past medical history.  Past Surgical History:  Procedure Laterality Date   TONSILLECTOMY      Family History  Problem Relation Age of Onset   Healthy Mother    Hyperlipidemia Father    Healthy Sister    Healthy Brother    Healthy Sister     Social History:  reports that she has never smoked. She has never used smokeless tobacco. She reports that she does not drink alcohol and does not use drugs.  Allergies: No Known Allergies  Medications: I have reviewed the patient's current medications.  Results for orders placed or performed during the hospital encounter of 12/13/19 (from the past 48 hour(s))  Comprehensive metabolic panel     Status: Abnormal   Collection Time: 12/13/19  7:16 PM  Result Value Ref Range   Sodium 132 (L) 135 - 145 mmol/L   Potassium 3.5 3.5 - 5.1 mmol/L   Chloride 97 (L) 98 - 111 mmol/L   CO2 22 22 - 32 mmol/L   Glucose, Bld 109 (H) 70 - 99 mg/dL    Comment: Glucose reference range applies only to samples taken after fasting for at least 8 hours.   BUN 16 6 - 20 mg/dL   Creatinine, Ser 1.61 (H) 0.44 - 1.00 mg/dL   Calcium 9.1 8.9 - 09.6 mg/dL   Total Protein 7.2 6.5 -  8.1 g/dL   Albumin 3.6 3.5 - 5.0 g/dL   AST 19 15 - 41 U/L   ALT 14 0 - 44 U/L   Alkaline Phosphatase 57 38 - 126 U/L   Total Bilirubin 1.2 0.3 - 1.2 mg/dL   GFR, Estimated 50 (L) >60 mL/min    Comment: (NOTE) Calculated using the CKD-EPI Creatinine Equation (2021)    Anion gap 13 5 - 15    Comment: Performed at [facility] Lab, [address] [place], [place] [address]  Lactic acid, plasma     Status: None   Collection Time: 12/13/19  7:16 PM  Result Value Ref Range   Lactic Acid, Venous 1.8 0.5 - 1.9 mmol/L    Comment: Performed at [facility], [address] [place], [place] [address]  CBC with Differential     Status: Abnormal   Collection Time: 12/13/19  7:16 PM  Result Value Ref Range   WBC 12.2 (H) 4.0 - 10.5 K/uL   RBC 4.15 3.87 - 5.11 MIL/uL   Hemoglobin 12.2 12.0 - 15.0 g/dL   HCT 14.7 (L) 36 - 46 %   MCV 86.0 80.0 - 100.0 fL   MCH 29.4 26.0 - 34.0 pg   MCHC 34.2 30.0 - 36.0 g/dL   RDW 82.9 56.2 - 13.0 %   Platelets  148 (L) 150 - 400 K/uL   nRBC 0.0 0.0 - 0.2 %   Neutrophils Relative % 80 %   Neutro Abs 9.8 (H) 1.7 - 7.7 K/uL   Lymphocytes Relative 4 %   Lymphs Abs 0.5 (L) 0.7 - 4.0 K/uL   Monocytes Relative 15 %   Monocytes Absolute 1.8 (H) 0.1 - 1.0 K/uL   Eosinophils Relative 0 %   Eosinophils Absolute 0.0 0.0 - 0.5 K/uL   Basophils Relative 0 %   Basophils Absolute 0.0 0.0 - 0.1 K/uL   Immature Granulocytes 1 %   Abs Immature Granulocytes 0.12 (H) 0.00 - 0.07 K/uL   Reactive, Benign Lymphocytes PRESENT     Comment: Performed at [facility], [address], [place], [place] [address]  Protime-INR     Status: None   Collection Time: 12/13/19  7:16 PM  Result Value Ref Range   Prothrombin Time 13.7 11.4 - 15.2 seconds   INR 1.1 0.8 - 1.2    Comment: (NOTE) INR goal varies based on device and disease states. Performed at [facility], [address] [place], [place] [address]   Culture, blood (Routine x 2)     Status: None  (Preliminary result)   Collection Time: 12/13/19  7:17 PM   Specimen: BLOOD  Result Value Ref Range   Specimen Description BLOOD SITE NOT SPECIFIED    Special Requests      BOTTLES DRAWN AEROBIC AND ANAEROBIC Blood Culture adequate volume   Culture      NO GROWTH < 24 HOURS Performed at [facility] Lab, [address], [place], [place] [address]    Report Status PENDING   I-Stat beta hCG blood, ED     Status: Abnormal   Collection Time: 12/13/19  7:33 PM  Result Value Ref Range   I-stat hCG, quantitative 23.1 (H) <5 mIU/mL   Comment 3            Comment:   GEST. AGE      CONC.  (mIU/mL)   <=1 WEEK        5 - 50     2 WEEKS       50 - 500     3 WEEKS       100 - 10,000     4 WEEKS     1,000 - 30,000        FEMALE AND NON-PREGNANT FEMALE:     LESS THAN 5 mIU/mL   hCG, quantitative, pregnancy     Status: None   Collection Time: 12/13/19  8:50 PM  Result Value Ref Range   hCG, Beta Chain, Quant, S 1 <5 mIU/mL    Comment:          GEST. AGE      CONC.  (mIU/mL)   <=1 WEEK        5 - 50     2 WEEKS       50 - 500     3 WEEKS       100 - 10,000     4 WEEKS     1,000 - 30,000     5 WEEKS     3,500 - 115,000   6-8 WEEKS     12,000 - 270,000    12 WEEKS     15,000 - 220,000        FEMALE AND NON-PREGNANT FEMALE:     LESS THAN 5 mIU/mL Performed at  [facility], [address] [place], [place] [address]   Lipase, blood     Status: None   Collection Time: 12/13/19  8:51 PM  Result Value Ref Range   Lipase 28 11 - 51 U/L    Comment: Performed at [facility] Lab, [address], [place], [place] [address]  D-dimer, quantitative (not at [facility])     Status: Abnormal   Collection Time: 12/13/19  8:51 PM  Result Value Ref Range   D-Dimer, Quant 2.12 (H) 0.00 - 0.50 ug/mL-FEU    Comment: (NOTE) At the manufacturer cut-off value of 0.5 g/mL FEU, this assay has a negative predictive value of 95-100%.This assay is intended for use in conjunction with a clinical pretest  probability (PTP) assessment model to exclude pulmonary embolism (PE) and deep venous thrombosis (DVT) in outpatients suspected of PE or DVT. Results should be correlated with clinical presentation. Performed at [facility] Lab, [address] [place], [place] [address]   Culture, blood (Routine x 2)     Status: None (Preliminary result)   Collection Time: 12/13/19  9:10 PM   Specimen: BLOOD  Result Value Ref Range   Specimen Description BLOOD RIGHT ANTECUBITAL    Special Requests      BOTTLES DRAWN AEROBIC AND ANAEROBIC Blood Culture adequate volume   Culture      NO GROWTH < 24 HOURS Performed at [facility] Lab, [address], [place], [place] [address]    Report Status PENDING   Culture, Urine     Status: Abnormal (Preliminary result)   Collection Time: 12/13/19 10:19 PM   Specimen: Urine, Random  Result Value Ref Range   Specimen Description URINE, RANDOM    Special Requests ADDED 0004 12/14/2019    Culture (A)     >=100,000 COLONIES/mL ESCHERICHIA COLI SUSCEPTIBILITIES TO FOLLOW Performed at [facility] Lab, [address], [place], [place] [address]    Report Status PENDING   Urinalysis, Routine w reflex microscopic Urine, Clean Catch     Status: Abnormal   Collection Time: 12/13/19 10:29 PM  Result Value Ref Range   Color, Urine YELLOW YELLOW   APPearance CLOUDY (A) CLEAR   Specific Gravity, Urine 1.009 1.005 - 1.030   pH 5.0 5.0 - 8.0   Glucose, UA NEGATIVE NEGATIVE mg/dL   Hgb urine dipstick MODERATE (A) NEGATIVE   Bilirubin Urine NEGATIVE NEGATIVE   Ketones, ur 5 (A) NEGATIVE mg/dL   Protein, ur 664 (A) NEGATIVE mg/dL   Nitrite NEGATIVE NEGATIVE   Leukocytes,Ua LARGE (A) NEGATIVE   RBC / HPF 0-5 0 - 5 RBC/hpf   WBC, UA >50 (H) 0 - 5 WBC/hpf   Bacteria, UA MANY (A) NONE SEEN   Squamous Epithelial / LPF 0-5 0 - 5   WBC Clumps PRESENT    Mucus PRESENT    Non Squamous Epithelial 0-5 (A) NONE SEEN    Comment: Performed at [facility],  [address] [place], [place] [address]  Respiratory Panel by RT PCR (Flu A&B, Covid) - Nasopharyngeal Swab     Status: None   Collection Time: 12/13/19 11:04 PM   Specimen: Nasopharyngeal Swab  Result Value Ref Range   SARS Coronavirus 2 by RT PCR NEGATIVE NEGATIVE    Comment: (NOTE) SARS-CoV-2 target nucleic acids are NOT DETECTED.  The SARS-CoV-2 RNA is generally detectable in upper respiratoy specimens during the acute phase of infection. The lowest concentration of SARS-CoV-2 viral copies this  assay can detect is 131 copies/mL. A negative result does not preclude SARS-Cov-2 infection and should not be used as the sole basis for treatment or other patient management decisions. A negative result may occur with  improper specimen collection/handling, submission of specimen other than nasopharyngeal swab, presence of viral mutation(s) within the areas targeted by this assay, and inadequate number of viral copies (<131 copies/mL). A negative result must be combined with clinical observations, patient history, and epidemiological information. The expected result is Negative.  Fact Sheet for Patients:  https://www.moore.com/  Fact Sheet for Healthcare Providers:  https://www.young.biz/  This test is no t yet approved or cleared by the [place] FDA and  has been authorized for detection and/or diagnosis of SARS-CoV-2 by FDA under an Emergency Use Authorization (EUA). This EUA will remain  in effect (meaning this test can be used) for the duration of the COVID-19 declaration under Section 564(b)(1) of the Act, 21 U.S.C. section 360bbb-3(b)(1), unless the authorization is terminated or revoked sooner.     Influenza A by PCR NEGATIVE NEGATIVE   Influenza B by PCR NEGATIVE NEGATIVE    Comment: (NOTE) The Xpert Xpress SARS-CoV-2/FLU/RSV assay is intended as an aid in  the diagnosis of influenza from Nasopharyngeal swab specimens and  should  not be used as a sole basis for treatment. Nasal washings and  aspirates are unacceptable for Xpert Xpress SARS-CoV-2/FLU/RSV  testing.  Fact Sheet for Patients: https://www.moore.com/  Fact Sheet for Healthcare Providers: https://www.young.biz/  This test is not yet approved or cleared by the [place] FDA and  has been authorized for detection and/or diagnosis of SARS-CoV-2 by  FDA under an Emergency Use Authorization (EUA). This EUA will remain  in effect (meaning this test can be used) for the duration of the  Covid-19 declaration under Section 564(b)(1) of the Act, 21  U.S.C. section 360bbb-3(b)(1), unless the authorization is  terminated or revoked. Performed at [facility], [address], [place], [place] [address]   Lactic acid, plasma     Status: None   Collection Time: 12/14/19  3:42 AM  Result Value Ref Range   Lactic Acid, Venous 1.7 0.5 - 1.9 mmol/L    Comment: Performed at [facility] Lab, [address] [place], [place] [address]  CBC WITH DIFFERENTIAL     Status: Abnormal   Collection Time: 12/14/19  3:42 AM  Result Value Ref Range   WBC 9.6 4.0 - 10.5 K/uL   RBC 3.27 (L) 3.87 - 5.11 MIL/uL   Hemoglobin 9.7 (L) 12.0 - 15.0 g/dL   HCT 09.8 (L) 36 - 46 %   MCV 89.3 80.0 - 100.0 fL   MCH 29.7 26.0 - 34.0 pg   MCHC 33.2 30.0 - 36.0 g/dL   RDW 11.9 14.7 - 82.9 %   Platelets 105 (L) 150 - 400 K/uL    Comment: REPEATED TO VERIFY PLATELET COUNT CONFIRMED BY SMEAR SPECIMEN CHECKED FOR CLOTS Immature Platelet Fraction may be clinically indicated, consider ordering this additional test [id number]    nRBC 0.0 0.0 - 0.2 %   Neutrophils Relative % 82 %   Neutro Abs 7.9 (H) 1.7 - 7.7 K/uL   Lymphocytes Relative 8 %   Lymphs Abs 0.8 0.7 - 4.0 K/uL   Monocytes Relative 7 %   Monocytes Absolute 0.6 0.1 - 1.0 K/uL   Eosinophils Relative 0 %   Eosinophils Absolute 0.0 0.0 - 0.5 K/uL   Basophils Relative 1 %  Basophils Absolute 0.1 0.0 - 0.1 K/uL   WBC Morphology INCREASED BANDS (>20% BANDS)    Immature Granulocytes 2 %   Abs Immature Granulocytes 0.17 (H) 0.00 - 0.07 K/uL    Comment: Performed at [facility] Lab, [address], [place], [place] [address]  Basic metabolic panel     Status: Abnormal   Collection Time: 12/14/19  3:42 AM  Result Value Ref Range   Sodium 136 135 - 145 mmol/L   Potassium 3.2 (L) 3.5 - 5.1 mmol/L   Chloride 103 98 - 111 mmol/L   CO2 22 22 - 32 mmol/L   Glucose, Bld 117 (H) 70 - 99 mg/dL    Comment: Glucose reference range applies only to samples taken after fasting for at least 8 hours.   BUN 13 6 - 20 mg/dL   Creatinine, Ser 6.04 (H) 0.44 - 1.00 mg/dL   Calcium 8.0 (L) 8.9 - 10.3 mg/dL   GFR, Estimated >54 >09 mL/min    Comment: (NOTE) Calculated using the CKD-EPI Creatinine Equation (2021)    Anion gap 11 5 - 15    Comment: Performed at [facility] Lab, [address], [place], [place] [address]  HIV Antibody (routine testing w rflx)     Status: None   Collection Time: 12/14/19  3:42 AM  Result Value Ref Range   HIV Screen 4th Generation wRfx Non Reactive Non Reactive    Comment: Performed at [facility], [address] [place], [place] [address]  ABO/Rh     Status: None   Collection Time: 12/14/19  3:42 AM  Result Value Ref Range   ABO/RH(D)      A POS Performed at [facility], [address] [place], [place] [address]   Type and screen [facility]     Status: None   Collection Time: 12/14/19  7:06 AM  Result Value Ref Range   ABO/RH(D) A POS    Antibody Screen NEG    Sample Expiration      12/17/2019,2359 Performed at [facility], [address] [place], [place] [address]   D-dimer, quantitative (not at [facility])     Status: Abnormal   Collection Time: 12/14/19  7:06 AM  Result Value Ref Range   D-Dimer, Quant 3.05 (H) 0.00 - 0.50 ug/mL-FEU    Comment: (NOTE) At the manufacturer cut-off value of  0.5 g/mL FEU, this assay has a negative predictive value of 95-100%.This assay is intended for use in conjunction with a clinical pretest probability (PTP) assessment model to exclude pulmonary embolism (PE) and deep venous thrombosis (DVT) in outpatients suspected of PE or DVT. Results should be correlated with clinical presentation. Performed at [facility], [address] [place], [place] [address]   Fibrinogen     Status: Abnormal   Collection Time: 12/14/19  7:06 AM  Result Value Ref Range   Fibrinogen 526 (H) 210 - 475 mg/dL    Comment: Performed at [facility], [address] [place], [place] [address]  Protime-INR     Status: Abnormal   Collection Time: 12/14/19  7:06 AM  Result Value Ref Range   Prothrombin Time 16.6 (H) 11.4 - 15.2 seconds   INR 1.4 (H) 0.8 - 1.2    Comment: (NOTE) INR goal varies based on device and disease states. Performed at [facility], [address] [place], [place] [address]   APTT  Status: Abnormal   Collection Time: 12/14/19  7:06 AM  Result Value Ref Range   aPTT 41 (H) 24 - 36 seconds    Comment:        IF BASELINE aPTT IS ELEVATED, SUGGEST PATIENT RISK ASSESSMENT BE USED TO DETERMINE APPROPRIATE ANTICOAGULANT THERAPY. Performed at [facility], [address] [place], [place] [address]   MRSA PCR Screening     Status: None   Collection Time: 12/14/19  7:07 AM   Specimen: Nasal Mucosa; Nasopharyngeal  Result Value Ref Range   MRSA by PCR NEGATIVE NEGATIVE    Comment:        The GeneXpert MRSA Assay (FDA approved for NASAL specimens only), is one component of a comprehensive MRSA colonization surveillance program. It is not intended to diagnose MRSA infection nor to guide or monitor treatment for MRSA infections. Performed at [facility], [address] [place], [place] [address]   Lactic acid, plasma     Status: None   Collection Time: 12/14/19  4:18 PM  Result Value Ref Range    Lactic Acid, Venous 0.8 0.5 - 1.9 mmol/L    Comment: Performed at [facility], [address], [place], [place] [address]  Factor 5 assay     Status: None   Collection Time: 12/14/19  4:18 PM  Result Value Ref Range   Factor V Activity 91 70 - 150 %    Comment: (NOTE) Molecular testing for the presence of factor V Leiden is recommended when evaluating the patient for thrombotic risk. Performed At: [facility] [address] [place], [place] [id number] Jolene Schimke MD [id number]   Factor 8 assay     Status: Abnormal   Collection Time: 12/14/19  4:18 PM  Result Value Ref Range   Coagulation Factor VIII 229 (H) 56 - 140 %    Comment: (NOTE) FVIII activity can increase in a variety of clinical situations including normal pregnancy, in samples drawn from patients (particularly children) who are visibly stressed at the time of phlebotomy, as acute phase reactants, or in response to certain drug therapies such as DDAVP.  Persistently elevated FVIII activity is a risk factor for venous thrombosis as well as recurrence of venous thrombosis.  Risk is graded and increases with the degree of elevation.  Although elevated FVIII activity has been identified to cluster within families, a genetic basis for the elevation has not yet been elucidated (Br J Haematol. 2012; 174:944-967). Performed At: [facility] [address] [place], [place] [id number] Jolene Schimke MD [id number]   CBC with Differential/Platelet     Status: Abnormal   Collection Time: 12/14/19  4:18 PM  Result Value Ref Range   WBC 11.0 (H) 4.0 - 10.5 K/uL   RBC 3.07 (L) 3.87 - 5.11 MIL/uL   Hemoglobin 9.0 (L) 12.0 - 15.0 g/dL   HCT 90.3 (L) 36 - 46 %   MCV 87.0 80.0 - 100.0 fL   MCH 29.3 26.0 - 34.0 pg   MCHC 33.7 30.0 - 36.0 g/dL   RDW 00.9 23.3 - 00.7 %   Platelets PLATELET CLUMPS NOTED ON SMEAR, UNABLE TO ESTIMATE 150 - 400 K/uL    Comment: Immature Platelet Fraction may be clinically  indicated, consider ordering this additional test [id number]    nRBC 0.0 0.0 - 0.2 %   Neutrophils Relative % 86 %   Neutro Abs 9.5 (H) 1.7 - 7.7 K/uL   Lymphocytes Relative 10 %  Lymphs Abs 1.1 0.7 - 4.0 K/uL   Monocytes Relative 4 %   Monocytes Absolute 0.4 0.1 - 1.0 K/uL   Eosinophils Relative 0 %   Eosinophils Absolute 0.0 0.0 - 0.5 K/uL   Basophils Relative 0 %   Basophils Absolute 0.0 0.0 - 0.1 K/uL   nRBC 0 0 /100 WBC   Abs Immature Granulocytes 0.00 0.00 - 0.07 K/uL    Comment: Performed at [facility], [address] [place], [place] [id number]  Lactic acid, plasma     Status: None   Collection Time: 12/14/19  8:53 PM  Result Value Ref Range   Lactic Acid, Venous 0.7 0.5 - 1.9 mmol/L    Comment: Performed at [facility], [address] [place], [place] [id number]  Glucose, capillary     Status: None   Collection Time: 12/14/19 11:39 PM  Result Value Ref Range   Glucose-Capillary 83 70 - 99 mg/dL    Comment: Glucose reference range applies only to samples taken after fasting for at least 8 hours.  CBC WITH DIFFERENTIAL     Status: Abnormal   Collection Time: 12/15/19  1:46 AM  Result Value Ref Range   WBC 11.4 (H) 4.0 - 10.5 K/uL   RBC 3.07 (L) 3.87 - 5.11 MIL/uL   Hemoglobin 9.0 (L) 12.0 - 15.0 g/dL   HCT 82.927.0 (L) 36 - 46 %   MCV 87.9 80.0 - 100.0 fL   MCH 29.3 26.0 - 34.0 pg   MCHC 33.3 30.0 - 36.0 g/dL   RDW 56.214.3 13.011.5 - 86.515.5 %   Platelets 110 (L) 150 - 400 K/uL    Comment: REPEATED TO VERIFY PLATELET COUNT CONFIRMED BY SMEAR SPECIMEN CHECKED FOR CLOTS Immature Platelet Fraction may be clinically indicated, consider ordering this additional test [id number]    nRBC 0.0 0.0 - 0.2 %   Neutrophils Relative % 71 %   Neutro Abs 8.1 (H) 1.7 - 7.7 K/uL   Lymphocytes Relative 10 %   Lymphs Abs 1.1 0.7 - 4.0 K/uL   Monocytes Relative 17 %   Monocytes Absolute 2.0 (H) 0.1 - 1.0 K/uL   Eosinophils Relative 0 %   Eosinophils Absolute 0.0 0.0 - 0.5 K/uL    Basophils Relative 1 %   Basophils Absolute 0.1 0.0 - 0.1 K/uL   Immature Granulocytes 1 %   Abs Immature Granulocytes 0.16 (H) 0.00 - 0.07 K/uL    Comment: Performed at [facility], [address] [place], [place] [id number]  Basic metabolic panel     Status: Abnormal   Collection Time: 12/15/19  1:46 AM  Result Value Ref Range   Sodium 132 (L) 135 - 145 mmol/L   Potassium 4.1 3.5 - 5.1 mmol/L   Chloride 105 98 - 111 mmol/L   CO2 21 (L) 22 - 32 mmol/L   Glucose, Bld 120 (H) 70 - 99 mg/dL    Comment: Glucose reference range applies only to samples taken after fasting for at least 8 hours.   BUN 7 6 - 20 mg/dL   Creatinine, Ser 3.240.96 0.44 - 1.00 mg/dL   Calcium 7.9 (L) 8.9 - 10.3 mg/dL   GFR, Estimated >40>60 >10>60 mL/min    Comment: (NOTE) Calculated using the CKD-EPI Creatinine Equation (2021)    Anion gap 6 5 - 15    Comment: Performed at [facility], [address] [place], [place] [id number]  Hepatic function panel  Status: Abnormal   Collection Time: 12/15/19  1:46 AM  Result Value Ref Range   Total Protein 5.2 (L) 6.5 - 8.1 g/dL   Albumin 2.4 (L) 3.5 - 5.0 g/dL   AST 20 15 - 41 U/L   ALT 16 0 - 44 U/L   Alkaline Phosphatase 38 38 - 126 U/L   Total Bilirubin 0.7 0.3 - 1.2 mg/dL   Bilirubin, Direct 0.2 0.0 - 0.2 mg/dL   Indirect Bilirubin 0.5 0.3 - 0.9 mg/dL    Comment: Performed at [facility] Lab, [address] [place], [place] [address]  Magnesium     Status: None   Collection Time: 12/15/19  1:46 AM  Result Value Ref Range   Magnesium 1.7 1.7 - 2.4 mg/dL    Comment: Performed at [facility], [address], [place], [place] [address]  Basic metabolic panel     Status: Abnormal   Collection Time: 12/15/19  7:22 AM  Result Value Ref Range   Sodium 134 (L) 135 - 145 mmol/L   Potassium 4.7 3.5 - 5.1 mmol/L   Chloride 104 98 - 111 mmol/L   CO2 20 (L) 22 - 32 mmol/L   Glucose, Bld 88 70 - 99 mg/dL    Comment: Glucose reference range applies  only to samples taken after fasting for at least 8 hours.   BUN 7 6 - 20 mg/dL   Creatinine, Ser 6.43 0.44 - 1.00 mg/dL   Calcium 8.4 (L) 8.9 - 10.3 mg/dL   GFR, Estimated >32 >95 mL/min    Comment: (NOTE) Calculated using the CKD-EPI Creatinine Equation (2021)    Anion gap 10 5 - 15    Comment: Performed at [facility], [address] [place], [place] [address]    DG Chest 2 View  Result Date: 12/13/2019 CLINICAL DATA:  Sepsis. Shortness of breath with upper abdominal pain. EXAM: CHEST - 2 VIEW COMPARISON:  December 12, 2019 FINDINGS: The heart size and mediastinal contours are within normal limits. Both lungs are clear. The visualized skeletal structures are unremarkable. IMPRESSION: No active cardiopulmonary disease. Electronically Signed   By: Katherine Mantle M.D.   On: 12/13/2019 19:35   CT Angio Chest PE W and/or Wo Contrast  Result Date: 12/14/2019 CLINICAL DATA:  Shortness of breath and upper abdominal pain x3 days. Fever but negative COVID test. EXAM: CT ANGIOGRAPHY CHEST CT ABDOMEN AND PELVIS WITH CONTRAST TECHNIQUE: Multidetector CT imaging of the chest was performed using the standard protocol during bolus administration of intravenous contrast. Multiplanar CT image reconstructions and MIPs were obtained to evaluate the vascular anatomy. Multidetector CT imaging of the abdomen and pelvis was performed using the standard protocol during bolus administration of intravenous contrast. CONTRAST:  OMNIPAQUE IOHEXOL 350 MG/ML SOLN COMPARISON:  None. FINDINGS: CTA CHEST FINDINGS Cardiovascular: Contrast injection is sufficient to demonstrate satisfactory opacification of the pulmonary arteries to the segmental level. There is no pulmonary embolus or evidence of right heart strain. The size of the main pulmonary artery is normal. Heart size is normal, with no pericardial effusion. The course and caliber of the aorta are normal. There is no atherosclerotic calcification.  Opacification decreased due to pulmonary arterial phase contrast bolus timing. Mediastinum/Nodes: -- No mediastinal lymphadenopathy. -- No hilar lymphadenopathy. -- No axillary lymphadenopathy. -- No supraclavicular lymphadenopathy. -- Normal thyroid gland where visualized. -  Unremarkable esophagus. Lungs/Pleura: There is a 5 mm pulmonary nodule in the left lower lobe (axial series 6, image  91). There is additional smaller 3-4 mm pulmonary nodule in the peripheral left lower lobe (axial series 6, image 93). Musculoskeletal: No chest wall abnormality. No bony spinal canal stenosis. CT ABDOMEN and PELVIS FINDINGS Hepatobiliary: The liver is normal. Normal gallbladder.There is no biliary ductal dilation. Pancreas: Normal contours without ductal dilatation. No peripancreatic fluid collection. Spleen: Unremarkable. Adrenals/Urinary Tract: --Adrenal glands: Unremarkable. --Right kidney/ureter: There is a complex appearing, irregular cystic structure in the right kidney measuring approximately 1.9 cm. This structure measures approximately 36 Hounsfield units. There is no right-sided hydronephrosis. There is some mild enhancement throughout the right ureter. --Left kidney/ureter: There is a striated appearance of the left kidney without evidence for hydronephrosis. There is diffuse enhancement throughout the left ureter. --Urinary bladder: There is mild wall thickening of the urinary bladder with adjacent fat stranding. Stomach/Bowel: --Stomach/Duodenum: No hiatal hernia or other gastric abnormality. Normal duodenal course and caliber. --Small bowel: Unremarkable. --Colon: Unremarkable. --Appendix: Normal. Vascular/Lymphatic: Normal course and caliber of the major abdominal vessels. There is probable mixing artifact in the bilateral common femoral veins. --No retroperitoneal lymphadenopathy. --No mesenteric lymphadenopathy. --No pelvic or inguinal lymphadenopathy. Reproductive: There appears to be some fluid in the vaginal  canal and lower uterine segment. Other: No ascites or free air. The abdominal wall is normal. Musculoskeletal. 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 Review of the MIP images confirms the above findings. IMPRESSION: 1. No acute pulmonary embolism. 2. Striated appearance of the left kidney, concerning for pyelonephritis. 3. There is a complex appearing cystic structure in the right kidney measuring approximately 1.9 cm. This is favored to represent a proteinaceous or hemorrhagic cyst. An abscess is felt to be less likely given the lack of findings suggestive of right-sided pyelonephritis. A follow-up nonemergent renal ultrasound is recommended for further evaluation of this finding. 4. There appears to be some fluid in the vaginal canal and lower uterine segment. Correlation with patient's symptoms and menstrual cycle is recommended. 5. 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 6. Small pulmonary nodules in the left lower lobe, the largest of which measures 5 mm. No follow-up needed if patient is low-risk. Non-contrast chest CT can be considered in 12 months if patient is high-risk. This recommendation follows the consensus statement: Guidelines for Management of Incidental Pulmonary Nodules Detected on CT Images: From the Fleischner Society 2017; Radiology 2017; 284:228-243. Electronically Signed   By: Katherine Mantle M.D.   On: 12/14/2019 00:23   CT ABDOMEN PELVIS W CONTRAST  Result Date: 12/14/2019 CLINICAL DATA:  Shortness of breath and upper abdominal pain x3 days. Fever but negative COVID test. EXAM: CT ANGIOGRAPHY CHEST CT ABDOMEN AND PELVIS WITH CONTRAST TECHNIQUE: Multidetector CT imaging of the chest was performed using the standard protocol during bolus administration of intravenous contrast. Multiplanar CT image reconstructions and MIPs were obtained to evaluate the vascular anatomy. Multidetector CT imaging of the abdomen and pelvis was performed  using the standard protocol during bolus administration of intravenous contrast. CONTRAST:  OMNIPAQUE IOHEXOL 350 MG/ML SOLN COMPARISON:  None. FINDINGS: CTA CHEST FINDINGS Cardiovascular: Contrast injection is sufficient to demonstrate satisfactory opacification of the pulmonary arteries to the segmental level. There is no pulmonary embolus or evidence of right heart strain. The size of the main pulmonary artery is normal. Heart size is normal, with no pericardial effusion. The course and caliber of the aorta are normal. There is no atherosclerotic calcification. Opacification decreased due to pulmonary arterial phase contrast bolus timing. Mediastinum/Nodes: -- No  mediastinal lymphadenopathy. -- No hilar lymphadenopathy. -- No axillary lymphadenopathy. -- No supraclavicular lymphadenopathy. -- Normal thyroid gland where visualized. -  Unremarkable esophagus. Lungs/Pleura: There is a 5 mm pulmonary nodule in the left lower lobe (axial series 6, image 91). There is additional smaller 3-4 mm pulmonary nodule in the peripheral left lower lobe (axial series 6, image 93). Musculoskeletal: No chest wall abnormality. No bony spinal canal stenosis. CT ABDOMEN and PELVIS FINDINGS Hepatobiliary: The liver is normal. Normal gallbladder.There is no biliary ductal dilation. Pancreas: Normal contours without ductal dilatation. No peripancreatic fluid collection. Spleen: Unremarkable. Adrenals/Urinary Tract: --Adrenal glands: Unremarkable. --Right kidney/ureter: There is a complex appearing, irregular cystic structure in the right kidney measuring approximately 1.9 cm. This structure measures approximately 36 Hounsfield units. There is no right-sided hydronephrosis. There is some mild enhancement throughout the right ureter. --Left kidney/ureter: There is a striated appearance of the left kidney without evidence for hydronephrosis. There is diffuse enhancement throughout the left ureter. --Urinary bladder: There is mild  wall thickening of the urinary bladder with adjacent fat stranding. Stomach/Bowel: --Stomach/Duodenum: No hiatal hernia or other gastric abnormality. Normal duodenal course and caliber. --Small bowel: Unremarkable. --Colon: Unremarkable. --Appendix: Normal. Vascular/Lymphatic: Normal course and caliber of the major abdominal vessels. There is probable mixing artifact in the bilateral common femoral veins. --No retroperitoneal lymphadenopathy. --No mesenteric lymphadenopathy. --No pelvic or inguinal lymphadenopathy. Reproductive: There appears to be some fluid in the vaginal canal and lower uterine segment. Other: No ascites or free air. The abdominal wall is normal. Musculoskeletal. 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 Review of the MIP images confirms the above findings. IMPRESSION: 1. No acute pulmonary embolism. 2. Striated appearance of the left kidney, concerning for pyelonephritis. 3. There is a complex appearing cystic structure in the right kidney measuring approximately 1.9 cm. This is favored to represent a proteinaceous or hemorrhagic cyst. An abscess is felt to be less likely given the lack of findings suggestive of right-sided pyelonephritis. A follow-up nonemergent renal ultrasound is recommended for further evaluation of this finding. 4. There appears to be some fluid in the vaginal canal and lower uterine segment. Correlation with patient's symptoms and menstrual cycle is recommended. 5. 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 6. Small pulmonary nodules in the left lower lobe, the largest of which measures 5 mm. No follow-up needed if patient is low-risk. Non-contrast chest CT can be considered in 12 months if patient is high-risk. This recommendation follows the consensus statement: Guidelines for Management of Incidental Pulmonary Nodules Detected on CT Images: From the Fleischner Society 2017; Radiology 2017; 284:228-243. Electronically Signed    By: Katherine Mantle M.D.   On: 12/14/2019 00:23   US RENAL  Result Date: 12/14/2019 CLINICAL DATA:  Right renal lesion EXAM: RENAL / URINARY TRACT ULTRASOUND COMPLETE COMPARISON:  CT abdomen pelvis 12/13/2019 FINDINGS: Right Kidney: Renal measurements: 10.8 x 4.0 x 5.8 cm = volume: 129 mL. 1.8 cm interpolar cyst corresponds to the lesion identified on the earlier CT. There are low level echoes within the ureter. No hydronephrosis. Left Kidney: Renal measurements: 11.2 x 5.8 x 4.8 cm = volume: 162 mL. No hydronephrosis or solid renal mass. There are low level echoes in the ureter. Bladder: Low level echoes in the bladder.  Both ureteral jets were seen. Other: None. IMPRESSION: 1. 1.8 cm interpolar right renal cyst. No solid renal mass. 2. Low-level echoes in the ureters and bladder, which may represent debris; however, this finding may  also be artifactual. Correlate with urinalysis. Electronically Signed   By: Deatra Robinson M.D.   On: 12/14/2019 03:05    ROS:ROS  Blood pressure (!) 86/47, pulse (!) 118, temperature (!) 103.2 F (39.6 C), temperature source Oral, resp. rate (!) 0, height 5\' 4"  (1.626 m), weight 58.7 kg, last menstrual period 12/03/2019, SpO2 (!) 87 %.  PHYSICAL EXAM: CONSTITUTIONAL: well developed, nourished, no acute distress and alert and oriented x 3 CARDIOVASCULAR: normal rate and regular rhythm PULMONARY/CHEST WALL: effort normal and no stridor, no stertor, no dysphonia HENT: Head : normocephalic and atraumatic Ears: Right ear:   canal normal, external ear normal and hearing normal Left ear:   canal normal, external ear normal and hearing normal Nose: Left caudal septum with mucosal excoriation and evidence of recent bleeding, no active epistaxis noted.  Right nasal cavity with no evidence of trauma or recent bleeding.  Mouth/Throat:  Mouth: uvula midline and no oral lesions, tonsils surgically absent, oropharynx clear and moist, no active bleeding noted on posterior  pharyngeal wall Mucous membranes: normal EYES: conjunctiva normal, EOM normal and PERRL NECK: supple, trachea normal and no thyromegaly or cervical LAD  Studies Reviewed:None  Procedures: Chemical cauterization of left septum The bleeding site was easily identified on the left anterior septum.  Lidocaine with Afrin was placed on a cotton ball and used to topically anesthetize the left caudal septum. After adequate time passed to allow topical anesthesia, silver nitrate was applied to the area of concern.  No bleeding was noted following procedure.  Area was then covered with antibiotic ointment. Patient tolerated well with no complications.   Assessment/Plan: Sonia Manning is a 24 y/o F admitted for pyelonephritis with spontaneous left-sided epistaxis.  -Bleeding site identified on left caudal septum, likely from nasal cannula -Area cauterized with silver nitrate, no further bleeding appreciated following procedure -Vaseline ointment to bilateral nares twice daily for next 10 days -Nosebleed precautions discussed with patient to include avoidance of nasal manipulation, no nose blowing, avoiding straining -Recommend application of Afrin to bilateral nares as needed for bleeding, with applied pressure over nares for at least 15 minutes -Outpatient follow-up with ENT as needed  Thank you for allowing me to participate in the care of this patient. Please do not hesitate to contact me with any questions or concerns.   Laren Boom, DO Otolaryngology [facility] ENT Cell: [phone]   12/15/2019, 12:58 PM

## 2019-12-15 NOTE — Consult Note (Signed)
NAME:  Sonia Manning, MRN:  [id number], DOB:  [birth year], LOS: 1 ADMISSION DATE:  12/13/2019, CONSULTATION DATE: 12/14/2019 REFERRING MD: Dr. Leonides Cave, CHIEF COMPLAINT: Septic shock  Brief History   24 year old female, denies other significant past medical history presented with fever right flank pain and suprapubic abdominal pain.  Patient found to have urinary tract infection concern for pyelonephritis and sepsis patient was started on antimicrobials and fluid resuscitation despite adequate fluid resuscitation patient developed hypotension and was started on norepinephrine.  Critical care was consulted recommendations management.   Past Medical History  History reviewed. No pertinent past medical history.   Significant Hospital Events   ICU admission  Consults:  Pulmonary critical care  Procedures:  Peripheral IVs  Significant Diagnostic Tests:  CT scan abdomen and pelvis: Concern for right-sided pyelonephritis.  Micro Data:  COVID-19 negative Blood cultures:, no growth to date Urine culture: E. coli  Antimicrobials:  Meropenem 11/15 >>  Interim history/subjective:  Patient continued to spike fever, T-max of 103, she is requiring low-dose Levophed even after multiple fluid boluses.  Started with dry cough since last night  Objective   Blood pressure 98/72, pulse (!) 107, temperature (!) 100.5 F (38.1 C), temperature source Oral, resp. rate (!) 22, height 5\' 4"  (1.626 m), weight 58.7 kg, last menstrual period 12/03/2019, SpO2 96 %.        Intake/Output Summary (Last 24 hours) at 12/15/2019 1109 Last data filed at 12/15/2019 0646 Gross per 24 hour  Intake 1573.67 ml  Output 1200 ml  Net 373.67 ml   Filed Weights   12/13/19 1902 12/14/19 0336 12/14/19 1100  Weight: 54.4 kg 54.4 kg 58.7 kg    Examination: General: Acutely ill-appearing young female, resting in bed HENT: Atraumatic, normocephalic, mucosa is moist, anicteric Lungs: Clear to auscultation  bilaterally, no crackles no wheeze Cardiovascular: Tachycardic, regular S1-S2 Abdomen: Suprapubic and right flank tenderness, bowel sounds present Extremities: No significant edema, no rash Neuro: Alert oriented following commands no focal deficit Skin: No rash  Resolved Hospital Problem list     Assessment & Plan:   Septic shock secondary to acute pyelonephritis, urinary tract infection, (POA ) Patient received multiple IV fluid boluses She continued to remain septic with fever and tachycardia She is intermittently requiring low-dose Levophed to maintain MAP > 65 Urine culture is growing E. coli, sensitivities pending Continue meropenem Started on azithromycin for possible atypical pneumonia as patient started dry cough, CT chest did not show any infiltrate which could be due to atypical presentation  AKI, resolved Presumptively prerenal and sepsis related AKI. Monitor serum creatinine  Hyponatremia Patient serum sodium dropped to 132, now it is improving to 134 Continue to monitor  Best practice:  Diet: advance diet as tolerated  Pain/Anxiety/Delirium protocol (if indicated): prn dialudid for pain  VAP protocol (if indicated): na DVT prophylaxis: lovenox GI prophylaxis: na Glucose control: na Mobility: BR Code Status: FULL  Family Communication: Patient's family was updated at bedside Disposition: Remain in ICU  Labs   CBC: Recent Labs  Lab 12/13/19 1916 12/14/19 0342 12/14/19 1618 12/15/19 0146  WBC 12.2* 9.6 11.0* 11.4*  NEUTROABS 9.8* 7.9* 9.5* 8.1*  HGB 12.2 9.7* 9.0* 9.0*  HCT 35.7* 29.2* 26.7* 27.0*  MCV 86.0 89.3 87.0 87.9  PLT 148* 105* PLATELET CLUMPS NOTED ON SMEAR, UNABLE TO ESTIMATE 110*    Basic Metabolic Panel: Recent Labs  Lab 12/13/19 1916 12/14/19 0342 12/15/19 0146 12/15/19 0722  NA 132* 136 132* 134*  K 3.5 3.2* 4.1  4.7  CL 97* 103 105 104  CO2 22 22 21* 20*  GLUCOSE 109* 117* 120* 88  BUN 16 13 7 7   CREATININE 1.51* 1.20* 0.96  0.90  CALCIUM 9.1 8.0* 7.9* 8.4*  MG  --   --  1.7  --    GFR: Estimated Creatinine Clearance: 83.9 mL/min (by C-G formula based on SCr of 0.9 mg/dL). Recent Labs  Lab 12/13/19 1916 12/14/19 0342 12/14/19 1618 12/14/19 2053 12/15/19 0146  WBC 12.2* 9.6 11.0*  --  11.4*  LATICACIDVEN 1.8 1.7 0.8 0.7  --     Liver Function Tests: Recent Labs  Lab 12/13/19 1916 12/15/19 0146  AST 19 20  ALT 14 16  ALKPHOS 57 38  BILITOT 1.2 0.7  PROT 7.2 5.2*  ALBUMIN 3.6 2.4*   Recent Labs  Lab 12/13/19 2051  LIPASE 28   No results for input(s): AMMONIA in the last 168 hours.  ABG No results found for: PHART, PCO2ART, PO2ART, HCO3, TCO2, ACIDBASEDEF, O2SAT   Coagulation Profile: Recent Labs  Lab 12/13/19 1916 12/14/19 0706  INR 1.1 1.4*    Cardiac Enzymes: No results for input(s): CKTOTAL, CKMB, CKMBINDEX, TROPONINI in the last 168 hours.  HbA1C: No results found for: HGBA1C  CBG: Recent Labs  Lab 12/14/19 2339  GLUCAP 83     Past Medical History  She,  has no past medical history on file.   Surgical History    Past Surgical History:  Procedure Laterality Date  . TONSILLECTOMY       Social History   reports that she has never smoked. She has never used smokeless tobacco. She reports that she does not drink alcohol and does not use drugs.   Family History   Her family history includes Healthy in her brother, mother, sister, and sister; Hyperlipidemia in her father.   Allergies No Known Allergies   Home Medications  Prior to Admission medications   Medication Sig Start Date End Date Taking? Authorizing Provider  benzonatate (TESSALON) 100 MG capsule Take 1 capsule (100 mg total) by mouth every 8 (eight) hours. 12/11/19  Yes Wurst, 13/12/21, PA-C  ibuprofen (ADVIL) 200 MG tablet Take 800 mg by mouth every 6 (six) hours as needed for moderate pain.   Yes [provider]  fluticasone (FLONASE) 50 MCG/ACT nasal spray Place 2 sprays into both  nostrils daily. Patient not taking: Reported on 12/14/2019 04/16/18   04/18/18, FNP   Total critical care time: 48 minutes  Performed by: Sonny Masters   Critical care time was exclusive of separately billable procedures and treating other patients.   Critical care was necessary to treat or prevent imminent or life-threatening deterioration.   Critical care was time spent personally by me on the following activities: development of treatment plan with patient and/or surrogate as well as nursing, discussions with consultants, evaluation of patient's response to treatment, examination of patient, obtaining history from patient or surrogate, ordering and performing treatments and interventions, ordering and review of laboratory studies, ordering and review of radiographic studies, pulse oximetry and re-evaluation of patient's condition.   Cheri Fowler MD Critical care physician [facility]  Pager: [phone] Mobile: [phone]

## 2019-12-16 LAB — BASIC METABOLIC PANEL
Anion gap: 7 (ref 5–15)
BUN: 6 mg/dL (ref 6–20)
CO2: 27 mmol/L (ref 22–32)
Calcium: 8.3 mg/dL — ABNORMAL LOW (ref 8.9–10.3)
Chloride: 104 mmol/L (ref 98–111)
Creatinine, Ser: 0.78 mg/dL (ref 0.44–1.00)
GFR, Estimated: >60 mL/min (ref >60)
Glucose, Bld: 97 mg/dL (ref 70–99)
Potassium: 4.1 mmol/L (ref 3.5–5.1)
Sodium: 138 mmol/L (ref 135–145)

## 2019-12-16 LAB — CBC WITH DIFFERENTIAL/PLATELET
Abs Immature Granulocytes: 0.11 10*3/uL — ABNORMAL HIGH (ref 0.00–0.07)
Basophils Absolute: 0 10*3/uL (ref 0.0–0.1)
Basophils Relative: 0 %
Eosinophils Absolute: 0.1 10*3/uL (ref 0.0–0.5)
Eosinophils Relative: 1 %
HCT: 29.7 % — ABNORMAL LOW (ref 36.0–46.0)
Hemoglobin: 9.8 g/dL — ABNORMAL LOW (ref 12.0–15.0)
Immature Granulocytes: 1 %
Lymphocytes Relative: 18 %
Lymphs Abs: 1.7 10*3/uL (ref 0.7–4.0)
MCH: 28.8 pg (ref 26.0–34.0)
MCHC: 33 g/dL (ref 30.0–36.0)
MCV: 87.4 fL (ref 80.0–100.0)
Monocytes Absolute: 0.9 10*3/uL (ref 0.1–1.0)
Monocytes Relative: 9 %
Neutro Abs: 6.7 10*3/uL (ref 1.7–7.7)
Neutrophils Relative %: 71 %
Platelets: 149 10*3/uL — ABNORMAL LOW (ref 150–400)
RBC: 3.4 MIL/uL — ABNORMAL LOW (ref 3.87–5.11)
RDW: 14.3 % (ref 11.5–15.5)
WBC: 9.5 10*3/uL (ref 4.0–10.5)
nRBC: 0 % (ref 0.0–0.2)

## 2019-12-16 LAB — URINE CULTURE: Culture: >=100000 — AB

## 2019-12-16 LAB — MAGNESIUM: Magnesium: 2.1 mg/dL (ref 1.7–2.4)

## 2019-12-16 MED ORDER — SODIUM CHLORIDE 0.9 % IV SOLN
1.0000 g | INTRAVENOUS | Status: DC
  Administered 2019-12-16 – 2019-12-19 (×4): 1 g via INTRAVENOUS
  Filled 2019-12-16: qty 1
  Filled 2019-12-16 (×3): qty 10

## 2019-12-16 MED ORDER — MORPHINE SULFATE (PF) 2 MG/ML IV SOLN
1.0000 mg | INTRAVENOUS | Status: DC | PRN
  Administered 2019-12-16: 2 mg via INTRAVENOUS
  Filled 2019-12-16: qty 1

## 2019-12-16 NOTE — Progress Notes (Signed)
eLink Physician-Brief Progress Note Patient Name: Sonia Manning DOB: 11-27-1995 MRN: [id number]   Date of Service  12/16/2019  HPI/Events of Note  Received request for am electrolytes  eICU Interventions  BMP, Mg ordered     Intervention Category Minor Interventions: Electrolytes abnormality - evaluation and management  Darl Pikes 12/16/2019, 12:22 AM

## 2019-12-16 NOTE — Plan of Care (Signed)
  Problem: Clinical Measurements: Goal: Will remain free from infection Outcome: Progressing Goal: Cardiovascular complication will be avoided Outcome: Progressing   Problem: Nutrition: Goal: Adequate nutrition will be maintained Outcome: Progressing   Problem: Coping: Goal: Level of anxiety will decrease Outcome: Progressing   

## 2019-12-16 NOTE — Progress Notes (Signed)
eLink Physician-Brief Progress Note Patient Name: Sonia Manning DOB: 21-Dec-1995 MRN: [id number]   Date of Service  12/16/2019  HPI/Events of Note  Patient admitted with pyelonephritis, has nausea, and is unable to take po pain medications.  eICU Interventions  PRN Morphine ordered.        Thomasene Lot [name] 12/16/2019, 7:53 PM

## 2019-12-16 NOTE — Progress Notes (Addendum)
NAME:  Sonia Manning, MRN:  [id number], DOB:  1995-07-07, LOS: 2 ADMISSION DATE:  12/13/2019, CONSULTATION DATE: 12/14/2019 REFERRING MD: Dr. Leonides Cave, CHIEF COMPLAINT: Septic shock  Brief History   24 year old female, denies other significant past medical history presented with fever right flank pain and suprapubic abdominal pain.  Patient found to have urinary tract infection concern for pyelonephritis and sepsis patient was started on antimicrobials and fluid resuscitation despite adequate fluid resuscitation patient developed hypotension and was started on norepinephrine.  Critical care was consulted recommendations management.   Past Medical History  History reviewed. No pertinent past medical history.   Significant Hospital Events   ICU admission  Consults:  Pulmonary critical care  Procedures:  Peripheral IVs  Significant Diagnostic Tests:  CT scan abdomen and pelvis: Concern for right-sided pyelonephritis.  Micro Data:  COVID-19 negative Blood cultures:, no growth to date Urine culture: E. coli  Antimicrobials:  Meropenem 11/15 >>  Interim history/subjective:  Patient continued to spike fever but her fever is gone is trending down, now T-max of 102 which she spiked last night.  Patient cough has improved after starting azithromycin  Objective   Blood pressure 90/61, pulse 71, temperature (!) 101.6 F (38.7 C), temperature source Oral, resp. rate 13, height 5\' 4"  (1.626 m), weight 58.7 kg, last menstrual period 12/03/2019, SpO2 99 %.        Intake/Output Summary (Last 24 hours) at 12/16/2019 0954 Last data filed at 12/16/2019 0805 Gross per 24 hour  Intake 3045.21 ml  Output 4650 ml  Net -1604.79 ml   Filed Weights   12/13/19 1902 12/14/19 0336 12/14/19 1100  Weight: 54.4 kg 54.4 kg 58.7 kg    Examination: General: Acutely ill-appearing young female, resting in bed with chills HENT: Atraumatic, normocephalic, mucosa is moist, anicteric Lungs: Clear to  auscultation bilaterally, no crackles no wheeze Cardiovascular: Tachycardic, regular S1-S2 Abdomen: Continue to have suprapubic and right flank tenderness, bowel sounds present Extremities: No significant edema, no rash Neuro: Alert oriented following commands no focal deficit Skin: No rash  Resolved Hospital Problem list   AKI Hyponatremia  Assessment & Plan:   Septic shock secondary to acute pyelonephritis, urinary tract infection with pansensitive E. coli, (POA ) Patient received multiple IV fluid boluses, then was on norepinephrine infusion for more than 24 hours Currently she is off vasopressors Urine culture grew pansensitive E. coli, meropenem was switched to ceftriaxone to complete total of 7 days therapy Continue azithromycin for possible atypical pneumonia as patient started dry cough, CT chest did not show any infiltrate which could be due to atypical presentation  Epistaxis Patient started bleeding through the nose yesterday, after pinching her nose for 15 minutes bleeding did not stop She was nebulized with TXA ENT consulted, patient was noted to have active bleeding from nasal septum, it was cauterized with silver nitrate Continue Vaseline ointment in both nares twice daily for 10 days Best practice:  Diet: advance diet as tolerated  Pain/Anxiety/Delirium protocol (if indicated): prn dialudid for pain  VAP protocol (if indicated): na DVT prophylaxis: lovenox GI prophylaxis: na Glucose control: na Mobility: BR Code Status: FULL  Family Communication: Patient's family was updated at bedside Disposition: Transfer to medical floor  Labs   CBC: Recent Labs  Lab 12/13/19 1916 12/14/19 0342 12/14/19 1618 12/15/19 0146 12/16/19 0428  WBC 12.2* 9.6 11.0* 11.4* 9.5  NEUTROABS 9.8* 7.9* 9.5* 8.1* 6.7  HGB 12.2 9.7* 9.0* 9.0* 9.8*  HCT 35.7* 29.2* 26.7* 27.0* 29.7*  MCV 86.0  89.3 87.0 87.9 87.4  PLT 148* 105* PLATELET CLUMPS NOTED ON SMEAR, UNABLE TO ESTIMATE 110*  149*    Basic Metabolic Panel: Recent Labs  Lab 12/13/19 1916 12/14/19 0342 12/15/19 0146 12/15/19 0722 12/16/19 0428  NA 132* 136 132* 134* 138  K 3.5 3.2* 4.1 4.7 4.1  CL 97* 103 105 104 104  CO2 22 22 21* 20* 27  GLUCOSE 109* 117* 120* 88 97  BUN 16 13 7 7 6   CREATININE 1.51* 1.20* 0.96 0.90 0.78  CALCIUM 9.1 8.0* 7.9* 8.4* 8.3*  MG  --   --  1.7  --  2.1   GFR: Estimated Creatinine Clearance: 94.4 mL/min (by C-G formula based on SCr of 0.78 mg/dL). Recent Labs  Lab 12/13/19 1916 12/13/19 1916 12/14/19 0342 12/14/19 1618 12/14/19 2053 12/15/19 0146 12/16/19 0428  WBC 12.2*   < > 9.6 11.0*  --  11.4* 9.5  LATICACIDVEN 1.8  --  1.7 0.8 0.7  --   --    < > = values in this interval not displayed.    Liver Function Tests: Recent Labs  Lab 12/13/19 1916 12/15/19 0146  AST 19 20  ALT 14 16  ALKPHOS 57 38  BILITOT 1.2 0.7  PROT 7.2 5.2*  ALBUMIN 3.6 2.4*   Recent Labs  Lab 12/13/19 2051  LIPASE 28   No results for input(s): AMMONIA in the last 168 hours.  ABG No results found for: PHART, PCO2ART, PO2ART, HCO3, TCO2, ACIDBASEDEF, O2SAT   Coagulation Profile: Recent Labs  Lab 12/13/19 1916 12/14/19 0706  INR 1.1 1.4*    Cardiac Enzymes: No results for input(s): CKTOTAL, CKMB, CKMBINDEX, TROPONINI in the last 168 hours.  HbA1C: No results found for: HGBA1C  CBG: Recent Labs  Lab 12/14/19 2339  GLUCAP 83     Past Medical History  She,  has no past medical history on file.   Surgical History    Past Surgical History:  Procedure Laterality Date  . TONSILLECTOMY       Social History   reports that she has never smoked. She has never used smokeless tobacco. She reports that she does not drink alcohol and does not use drugs.   Family History   Her family history includes Healthy in her brother, mother, sister, and sister; Hyperlipidemia in her father.   Allergies No Known Allergies   Home Medications  Prior to Admission  medications   Medication Sig Start Date End Date Taking? Authorizing Provider  benzonatate (TESSALON) 100 MG capsule Take 1 capsule (100 mg total) by mouth every 8 (eight) hours. 12/11/19  Yes Wurst, 13/12/21, PA-C  ibuprofen (ADVIL) 200 MG tablet Take 800 mg by mouth every 6 (six) hours as needed for moderate pain.   Yes [provider]  fluticasone (FLONASE) 50 MCG/ACT nasal spray Place 2 sprays into both nostrils daily. Patient not taking: Reported on 12/14/2019 04/16/18   04/18/18, FNP     Sonny Masters MD Critical care physician [facility] [place]  Pager: [phone] Mobile: [phone]

## 2019-12-17 NOTE — Progress Notes (Signed)
Progress Note    Sonia Manning  HKV:[id number] DOB: [birth year]  DOA: 12/13/2019 PCP: Dettinger, Elige Radon, MD    Brief Narrative:    Medical records reviewed and are as summarized below:  Sonia Manning is an 24 y.o. female presented with fever right flank pain and suprapubic abdominal pain.  Patient found to have urinary tract infection concern for pyelonephritis and sepsis patient was started on antimicrobials and fluid resuscitation despite adequate fluid resuscitation patient developed hypotension and was started on norepinephrine.    Assessment/Plan:   Principal Problem:   Sepsis due to urinary tract infection (HCC) Active Problems:   Mild renal insufficiency   Lung nodule   Hyponatremia   Kidney lesion, native, right   Sepsis (HCC)   Septic shock secondary to acute pyelonephritis, urinary tract infection with pansensitive E. coli, (POA ) plus atypical PNA Patient received multiple IV fluid boluses, then was on norepinephrine infusion for more than 24 hours  off vasopressors -blood cultures negative Urine culture grew pansensitive E. coli, meropenem was switched to ceftriaxone to complete total of 7 days therapy Continue azithromycin for possible atypical pneumonia as patient has dry cough, CT chest did not show any infiltrate which could be due to atypical presentation -renal U/S: 1.8 cm interpolar right renal cyst. No solid renal mass  Epistaxis ENT consulted, patient was noted to have active bleeding from nasal septum, it was cauterized with silver nitrate Continue Vaseline ointment in both nares twice daily for 10 days  Nausea/vomiting -poor PO intake -continue IV abx until able to keep food down  Small pulmonary nodules in the left lower lobe, the largest of which measures 5 mm. No follow-up needed if patient is low-risk. Non-contrast chest CT can be considered in 12 months if patient is high-risk.  Family Communication/Anticipated D/C date and plan/Code  Status   DVT prophylaxis: Lovenox ordered. Code Status: Full Code.  Disposition Plan: Status is: Inpatient  Remains inpatient appropriate because:Inpatient level of care appropriate due to severity of illness   Dispo: The patient is from: Home              Anticipated d/c is to: Home              Anticipated d/c date is: 1 day              Patient currently is not medically stable to d/c.         Medical Consultants:    ENT  PCCM     Subjective:   Vomited 6 times yesterday Has not been able to eat breakfast this AM  Objective:    Vitals:   12/17/19 0400 12/17/19 0509 12/17/19 0700 12/17/19 0800  BP: 100/60   (!) 92/59  Pulse:    87  Resp: 17     Temp:  99 F (37.2 C) 99.2 F (37.3 C)   TempSrc:  Oral Oral   SpO2: 98%   97%  Weight:      Height:        Intake/Output Summary (Last 24 hours) at 12/17/2019 0925 Last data filed at 12/16/2019 1209 Gross per 24 hour  Intake 600 ml  Output 400 ml  Net 200 ml   Filed Weights   12/13/19 1902 12/14/19 0336 12/14/19 1100  Weight: 54.4 kg 54.4 kg 58.7 kg    Exam:  General: Appearance:    Ill appearing female in no acute distress     Lungs:      respirations  unlabored  Heart:    Normal heart rate. Normal rhythm. No murmurs, rubs, or gallops.   MS:   All extremities are intact.   Neurologic:   Awake, alert, oriented x 3. No apparent focal neurological           defect.     Data Reviewed:   I have personally reviewed following labs and imaging studies:  Labs: Labs show the following:   Basic Metabolic Panel: Recent Labs  Lab 12/13/19 1916 12/13/19 1916 12/14/19 0342 12/14/19 0342 12/15/19 0146 12/15/19 0146 12/15/19 0722 12/16/19 0428  NA 132*  --  136  --  132*  --  134* 138  K 3.5   < > 3.2*   < > 4.1   < > 4.7 4.1  CL 97*  --  103  --  105  --  104 104  CO2 22  --  22  --  21*  --  20* 27  GLUCOSE 109*  --  117*  --  120*  --  88 97  BUN 16  --  13  --  7  --  7 6  CREATININE 1.51*   --  1.20*  --  0.96  --  0.90 0.78  CALCIUM 9.1  --  8.0*  --  7.9*  --  8.4* 8.3*  MG  --   --   --   --  1.7  --   --  2.1   < > = values in this interval not displayed.   GFR Estimated Creatinine Clearance: 94.4 mL/min (by C-G formula based on SCr of 0.78 mg/dL). Liver Function Tests: Recent Labs  Lab 12/13/19 1916 12/15/19 0146  AST 19 20  ALT 14 16  ALKPHOS 57 38  BILITOT 1.2 0.7  PROT 7.2 5.2*  ALBUMIN 3.6 2.4*   Recent Labs  Lab 12/13/19 2051  LIPASE 28   No results for input(s): AMMONIA in the last 168 hours. Coagulation profile Recent Labs  Lab 12/13/19 1916 12/14/19 0706  INR 1.1 1.4*    CBC: Recent Labs  Lab 12/13/19 1916 12/14/19 0342 12/14/19 1618 12/15/19 0146 12/16/19 0428  WBC 12.2* 9.6 11.0* 11.4* 9.5  NEUTROABS 9.8* 7.9* 9.5* 8.1* 6.7  HGB 12.2 9.7* 9.0* 9.0* 9.8*  HCT 35.7* 29.2* 26.7* 27.0* 29.7*  MCV 86.0 89.3 87.0 87.9 87.4  PLT 148* 105* PLATELET CLUMPS NOTED ON SMEAR, UNABLE TO ESTIMATE 110* 149*   Cardiac Enzymes: No results for input(s): CKTOTAL, CKMB, CKMBINDEX, TROPONINI in the last 168 hours. BNP (last 3 results) No results for input(s): PROBNP in the last 8760 hours. CBG: Recent Labs  Lab 12/14/19 2339  GLUCAP 83   D-Dimer: No results for input(s): DDIMER in the last 72 hours. Hgb A1c: No results for input(s): HGBA1C in the last 72 hours. Lipid Profile: No results for input(s): CHOL, HDL, LDLCALC, TRIG, CHOLHDL, LDLDIRECT in the last 72 hours. Thyroid function studies: No results for input(s): TSH, T4TOTAL, T3FREE, THYROIDAB in the last 72 hours.  Invalid input(s): FREET3 Anemia work up: No results for input(s): VITAMINB12, FOLATE, FERRITIN, TIBC, IRON, RETICCTPCT in the last 72 hours. Sepsis Labs: Recent Labs  Lab 12/13/19 1916 12/13/19 1916 12/14/19 0342 12/14/19 1618 12/14/19 2053 12/15/19 0146 12/16/19 0428  WBC 12.2*   < > 9.6 11.0*  --  11.4* 9.5  LATICACIDVEN 1.8  --  1.7 0.8 0.7  --   --    < > =  values in this interval not  displayed.    Microbiology Recent Results (from the past 240 hour(s))  COVID-19, Flu A+B and RSV ([facility])     Status: None   Collection Time: 12/11/19  6:39 PM  Result Value Ref Range Status   SARS-CoV-2, NAA Not Detected Not Detected Final    Comment: This nucleic acid amplification test was developed and its performance characteristics determined by [employer]. Nucleic acid amplification tests include RT-PCR and TMA. This test has not been FDA cleared or approved. This test has been authorized by FDA under an Emergency Use Authorization (EUA). This test is only authorized for the duration of time the declaration that circumstances exist justifying the authorization of the emergency use of in vitro diagnostic tests for detection of SARS-CoV-2 virus and/or diagnosis of COVID-19 infection under section 564(b)(1) of the Act, 21 U.S.C. 161WRU-0(A) (1), unless the authorization is terminated or revoked sooner. When diagnostic testing is negative, the possibility of a false negative result should be considered in the context of a patient's recent exposures and the presence of clinical signs and symptoms consistent with COVID-19. An individual without symptoms of COVID-19 and who is not shedding SARS-CoV-2 virus wo uld expect to have a negative (not detected) result in this assay.    Influenza A, NAA Not Detected Not Detected Final   Influenza B, NAA Not Detected Not Detected Final   RSV, NAA Not Detected Not Detected Final  Culture, blood (Routine x 2)     Status: None (Preliminary result)   Collection Time: 12/13/19  7:17 PM   Specimen: BLOOD  Result Value Ref Range Status   Specimen Description BLOOD SITE NOT SPECIFIED  Final   Special Requests   Final    BOTTLES DRAWN AEROBIC AND ANAEROBIC Blood Culture adequate volume   Culture   Final    NO GROWTH 3 DAYS Performed at [facility], [address], [place], [place] [address]     Report Status PENDING  Incomplete  Culture, blood (Routine x 2)     Status: None (Preliminary result)   Collection Time: 12/13/19  9:10 PM   Specimen: BLOOD  Result Value Ref Range Status   Specimen Description BLOOD RIGHT ANTECUBITAL  Final   Special Requests   Final    BOTTLES DRAWN AEROBIC AND ANAEROBIC Blood Culture adequate volume   Culture   Final    NO GROWTH 3 DAYS Performed at [facility], [address], [place], [place] [address]    Report Status PENDING  Incomplete  Culture, Urine     Status: Abnormal   Collection Time: 12/13/19 10:19 PM   Specimen: Urine, Random  Result Value Ref Range Status   Specimen Description URINE, RANDOM  Final   Special Requests   Final    ADDED 0004 12/14/2019 Performed at [facility], [address], [place], [place] [address]    Culture >=100,000 COLONIES/mL ESCHERICHIA COLI (A)  Final   Report Status 12/16/2019 FINAL  Final   Organism ID, Bacteria ESCHERICHIA COLI (A)  Final      Susceptibility   Escherichia coli - MIC*    AMPICILLIN <=2 SENSITIVE Sensitive     CEFAZOLIN <=4 SENSITIVE Sensitive     CEFEPIME <=0.12 SENSITIVE Sensitive     CEFTRIAXONE <=0.25 SENSITIVE Sensitive     CIPROFLOXACIN <=0.25 SENSITIVE Sensitive     GENTAMICIN <=1 SENSITIVE Sensitive     IMIPENEM <=0.25 SENSITIVE Sensitive     NITROFURANTOIN <=16 SENSITIVE Sensitive  TRIMETH/SULFA <=20 SENSITIVE Sensitive     AMPICILLIN/SULBACTAM <=2 SENSITIVE Sensitive     PIP/TAZO <=4 SENSITIVE Sensitive     * >=100,000 COLONIES/mL ESCHERICHIA COLI  Respiratory Panel by RT PCR (Flu A&B, Covid) - Nasopharyngeal Swab     Status: None   Collection Time: 12/13/19 11:04 PM   Specimen: Nasopharyngeal Swab  Result Value Ref Range Status   SARS Coronavirus 2 by RT PCR NEGATIVE NEGATIVE Final    Comment: (NOTE) SARS-CoV-2 target nucleic acids are NOT DETECTED.  The SARS-CoV-2 RNA is generally detectable in upper respiratoy specimens during the acute phase of  infection. The lowest concentration of SARS-CoV-2 viral copies this assay can detect is 131 copies/mL. A negative result does not preclude SARS-Cov-2 infection and should not be used as the sole basis for treatment or other patient management decisions. A negative result may occur with  improper specimen collection/handling, submission of specimen other than nasopharyngeal swab, presence of viral mutation(s) within the areas targeted by this assay, and inadequate number of viral copies (<131 copies/mL). A negative result must be combined with clinical observations, patient history, and epidemiological information. The expected result is Negative.  Fact Sheet for Patients:  https://www.moore.com/https://www.fda.gov/media/142436/download  Fact Sheet for Healthcare Providers:  https://www.young.biz/https://www.fda.gov/media/142435/download  This test is no t yet approved or cleared by the [place] FDA and  has been authorized for detection and/or diagnosis of SARS-CoV-2 by FDA under an Emergency Use Authorization (EUA). This EUA will remain  in effect (meaning this test can be used) for the duration of the COVID-19 declaration under Section 564(b)(1) of the Act, 21 U.S.C. section 360bbb-3(b)(1), unless the authorization is terminated or revoked sooner.     Influenza A by PCR NEGATIVE NEGATIVE Final   Influenza B by PCR NEGATIVE NEGATIVE Final    Comment: (NOTE) The Xpert Xpress SARS-CoV-2/FLU/RSV assay is intended as an aid in  the diagnosis of influenza from Nasopharyngeal swab specimens and  should not be used as a sole basis for treatment. Nasal washings and  aspirates are unacceptable for Xpert Xpress SARS-CoV-2/FLU/RSV  testing.  Fact Sheet for Patients: https://www.moore.com/https://www.fda.gov/media/142436/download  Fact Sheet for Healthcare Providers: https://www.young.biz/https://www.fda.gov/media/142435/download  This test is not yet approved or cleared by the [place] FDA and  has been authorized for detection and/or diagnosis of SARS-CoV-2  by  FDA under an Emergency Use Authorization (EUA). This EUA will remain  in effect (meaning this test can be used) for the duration of the  Covid-19 declaration under Section 564(b)(1) of the Act, 21  U.S.C. section 360bbb-3(b)(1), unless the authorization is  terminated or revoked. Performed at [facility], [address] [place], [place] [address]   MRSA PCR Screening     Status: None   Collection Time: 12/14/19  7:07 AM   Specimen: Nasal Mucosa; Nasopharyngeal  Result Value Ref Range Status   MRSA by PCR NEGATIVE NEGATIVE Final    Comment:        The GeneXpert MRSA Assay (FDA approved for NASAL specimens only), is one component of a comprehensive MRSA colonization surveillance program. It is not intended to diagnose MRSA infection nor to guide or monitor treatment for MRSA infections. Performed at [facility], [address] [place], [place] [address]     Procedures and diagnostic studies:  No results found.  Medications:   . Chlorhexidine Gluconate Cloth  6 each Topical Daily  . enoxaparin (LOVENOX) injection  40 mg Subcutaneous Q24H   Continuous Infusions: . sodium chloride 0  mL/hr at 12/15/19 0057  . azithromycin Stopped (12/16/19 1309)  . cefTRIAXone (ROCEPHIN)  IV 1 g (12/17/19 7494)     LOS: 3 days   Joseph Art  [facility]   How to contact the [facility] Attending or Consulting provider 7A - 7P or covering provider during after hours 7P -7A, for this patient?  1. Check the care team in [facility] and look for a) attending/consulting [facility] provider listed and b) the [facility] team listed 2. Log into [email or web address] and use [facility]'s universal password to access. If you do not have the password, please contact the hospital operator. 3. Locate the [facility] provider you are looking for under [facility] and page to a number that you can be directly reached. 4. If you still have difficulty reaching the provider, please page the [facility] (Director on  Call) for the Hospitalists listed on amion for assistance.  12/17/2019, 9:25 AM

## 2019-12-18 ENCOUNTER — Other Ambulatory Visit (HOSPITAL_COMMUNITY): Payer: Self-pay | Admitting: Internal Medicine

## 2019-12-18 LAB — BASIC METABOLIC PANEL
Anion gap: 8 (ref 5–15)
BUN: <5 mg/dL — ABNORMAL LOW (ref 6–20)
CO2: 27 mmol/L (ref 22–32)
Calcium: 8.8 mg/dL — ABNORMAL LOW (ref 8.9–10.3)
Chloride: 104 mmol/L (ref 98–111)
Creatinine, Ser: 0.61 mg/dL (ref 0.44–1.00)
GFR, Estimated: >60 mL/min (ref >60)
Glucose, Bld: 97 mg/dL (ref 70–99)
Potassium: 3.7 mmol/L (ref 3.5–5.1)
Sodium: 139 mmol/L (ref 135–145)

## 2019-12-18 LAB — CULTURE, BLOOD (ROUTINE X 2)
Culture: NO GROWTH
Culture: NO GROWTH
Special Requests: ADEQUATE
Special Requests: ADEQUATE

## 2019-12-18 LAB — CBC
HCT: 30.1 % — ABNORMAL LOW (ref 36.0–46.0)
Hemoglobin: 9.8 g/dL — ABNORMAL LOW (ref 12.0–15.0)
MCH: 28.2 pg (ref 26.0–34.0)
MCHC: 32.6 g/dL (ref 30.0–36.0)
MCV: 86.7 fL (ref 80.0–100.0)
Platelets: 229 10*3/uL (ref 150–400)
RBC: 3.47 MIL/uL — ABNORMAL LOW (ref 3.87–5.11)
RDW: 13.9 % (ref 11.5–15.5)
WBC: 7 10*3/uL (ref 4.0–10.5)
nRBC: 0 % (ref 0.0–0.2)

## 2019-12-18 LAB — PROTIME-INR
INR: 1 (ref 0.8–1.2)
Prothrombin Time: 13.1 seconds (ref 11.4–15.2)

## 2019-12-18 MED ORDER — CEFUROXIME AXETIL 500 MG PO TABS: 500.0000 mg | ORAL_TABLET | Freq: Two times a day (BID) | ORAL | 0 refills | Status: DC

## 2019-12-18 MED ORDER — FLUCONAZOLE 150 MG PO TABS
150.0000 mg | ORAL_TABLET | Freq: Once | ORAL | Status: AC
  Administered 2019-12-18: 150 mg via ORAL
  Filled 2019-12-18: qty 1

## 2019-12-18 MED ORDER — CEFDINIR 300 MG PO CAPS
300.0000 mg | ORAL_CAPSULE | Freq: Two times a day (BID) | ORAL | 0 refills | Status: DC
Start: 2019-12-18 — End: 2019-12-18

## 2019-12-18 MED ORDER — CLOTRIMAZOLE 1 % VA CREA
1.0000 | TOPICAL_CREAM | Freq: Every day | VAGINAL | Status: DC
  Administered 2019-12-18: 1 via VAGINAL
  Filled 2019-12-18: qty 45

## 2019-12-18 MED ORDER — AZITHROMYCIN 250 MG PO TABS
500.0000 mg | ORAL_TABLET | Freq: Every day | ORAL | Status: AC
  Administered 2019-12-18 – 2019-12-19 (×2): 500 mg via ORAL
  Filled 2019-12-18 (×3): qty 2

## 2019-12-18 MED FILL — CEFDINIR 300 MG CAPSULE: 300 | 3 days supply | Qty: 6 | Fill #0

## 2019-12-18 NOTE — Progress Notes (Addendum)
Progress Note    Sonia Manning  YHC:[id number] DOB: 1995-12-04  DOA: 12/13/2019 PCP: Dettinger, Elige Radon, MD    Brief Narrative:    Medical records reviewed and are as summarized below:  Sonia Manning is an 24 y.o. female presented with fever right flank pain and suprapubic abdominal pain.  Patient found to have urinary tract infection concern for pyelonephritis and sepsis patient was started on antimicrobials and fluid resuscitation despite adequate fluid resuscitation patient developed hypotension and was started on norepinephrine.     Assessment/Plan:   Principal Problem:   Sepsis due to urinary tract infection (HCC) Active Problems:   Mild renal insufficiency   Lung nodule   Hyponatremia   Kidney lesion, native, right   Sepsis (HCC)   Septic shock secondary to acute pyelonephritis, urinary tract infection with pansensitive E. coli, (POA ) plus atypical PNA Patient received multiple IV fluid boluses, then was on norepinephrine infusion for more than 24 hours  off vasopressors -blood cultures negative Urine culture grew pansensitive E. coli, meropenem was switched to ceftriaxone to complete total of 7 days therapy (plant o switch to PO to finish course if taking in orals consistently)  Continue azithromycin for possible atypical pneumonia as patient has dry cough, CT chest did not show any infiltrate which could be due to atypical presentation -renal U/S: 1.8 cm interpolar right renal cyst. No solid renal mass-- no further temp (low grade of actual)-- do not hink repeat U/S is needed currently  Epistaxis ENT consulted, patient was noted to have active bleeding from nasal septum, it was cauterized with silver nitrate Continue Vaseline ointment in both nares twice daily for 10 days  Nausea/vomiting -poor PO intake -continue IV abx until able to keep food down  Vaginal yeast infection -dilflucan PO if able -will also use topical meds  Small pulmonary nodules in  the left lower lobe, the largest of which measures 5 mm. No follow-up needed if patient is low-risk. Non-contrast chest CT can be considered in 12 months if patient is high-risk.  Family Communication/Anticipated D/C date and plan/Code Status   DVT prophylaxis: Lovenox ordered. Code Status: Full Code.  Disposition Plan: Status is: Inpatient  Remains inpatient appropriate because:Inpatient level of care appropriate due to severity of illness   Dispo: The patient is from: Home              Anticipated d/c is to: Home              Anticipated d/c date is: 1 day              Patient currently is not medically stable to d/c. home in AM if able to take in PO         Medical Consultants:    ENT  PCCM   Subjective:   Has not eaten breakfast but also has not vomited today C/o itching like she has with yeast infections  Objective:    Vitals:   12/17/19 1345 12/17/19 2025 12/18/19 0158 12/18/19 0418  BP: (!) 84/55 95/64 96/62  95/65  Pulse: 78 87 85 84  Resp: 16 17 16 17   Temp: 99.3 F (37.4 C) 99.6 F (37.6 C) 99.2 F (37.3 C) 99.3 F (37.4 C)  TempSrc: Oral Oral Oral Oral  SpO2: 100% 100% 100% 100%  Weight:      Height:       No intake or output data in the 24 hours ending 12/18/19 1050 Filed Weights   12/13/19 1902  12/14/19 0336 12/14/19 1100  Weight: 54.4 kg 54.4 kg 58.7 kg    Exam:  General: Appearance:    Well developed, well nourished female in no acute distress     Lungs:     Clear to auscultation bilaterally, respirations unlabored  Heart:    Normal heart rate. Normal rhythm. No murmurs, rubs, or gallops.   MS:   All extremities are intact.   Neurologic:   Awake, alert, oriented x 3. No apparent focal neurological           defect.      Data Reviewed:   I have personally reviewed following labs and imaging studies:  Labs: Labs show the following:   Basic Metabolic Panel: Recent Labs  Lab 12/14/19 0342 12/14/19 0342 12/15/19 0146  12/15/19 0146 12/15/19 0722 12/15/19 0722 12/16/19 0428 12/18/19 0054  NA 136  --  132*  --  134*  --  138 139  K 3.2*   < > 4.1   < > 4.7   < > 4.1 3.7  CL 103  --  105  --  104  --  104 104  CO2 22  --  21*  --  20*  --  27 27  GLUCOSE 117*  --  120*  --  88  --  97 97  BUN 13  --  7  --  7  --  6 <5*  CREATININE 1.20*  --  0.96  --  0.90  --  0.78 0.61  CALCIUM 8.0*  --  7.9*  --  8.4*  --  8.3* 8.8*  MG  --   --  1.7  --   --   --  2.1  --    < > = values in this interval not displayed.   GFR Estimated Creatinine Clearance: 94.4 mL/min (by C-G formula based on SCr of 0.61 mg/dL). Liver Function Tests: Recent Labs  Lab 12/13/19 1916 12/15/19 0146  AST 19 20  ALT 14 16  ALKPHOS 57 38  BILITOT 1.2 0.7  PROT 7.2 5.2*  ALBUMIN 3.6 2.4*   Recent Labs  Lab 12/13/19 2051  LIPASE 28   No results for input(s): AMMONIA in the last 168 hours. Coagulation profile Recent Labs  Lab 12/13/19 1916 12/14/19 0706 12/18/19 0054  INR 1.1 1.4* 1.0    CBC: Recent Labs  Lab 12/13/19 1916 12/13/19 1916 12/14/19 0342 12/14/19 1618 12/15/19 0146 12/16/19 0428 12/18/19 0054  WBC 12.2*   < > 9.6 11.0* 11.4* 9.5 7.0  NEUTROABS 9.8*  --  7.9* 9.5* 8.1* 6.7  --   HGB 12.2   < > 9.7* 9.0* 9.0* 9.8* 9.8*  HCT 35.7*   < > 29.2* 26.7* 27.0* 29.7* 30.1*  MCV 86.0   < > 89.3 87.0 87.9 87.4 86.7  PLT 148*   < > 105* PLATELET CLUMPS NOTED ON SMEAR, UNABLE TO ESTIMATE 110* 149* 229   < > = values in this interval not displayed.   Cardiac Enzymes: No results for input(s): CKTOTAL, CKMB, CKMBINDEX, TROPONINI in the last 168 hours. BNP (last 3 results) No results for input(s): PROBNP in the last 8760 hours. CBG: Recent Labs  Lab 12/14/19 2339  GLUCAP 83   D-Dimer: No results for input(s): DDIMER in the last 72 hours. Hgb A1c: No results for input(s): HGBA1C in the last 72 hours. Lipid Profile: No results for input(s): CHOL, HDL, LDLCALC, TRIG, CHOLHDL, LDLDIRECT in the last 72  hours. Thyroid function  studies: No results for input(s): TSH, T4TOTAL, T3FREE, THYROIDAB in the last 72 hours.  Invalid input(s): FREET3 Anemia work up: No results for input(s): VITAMINB12, FOLATE, FERRITIN, TIBC, IRON, RETICCTPCT in the last 72 hours. Sepsis Labs: Recent Labs  Lab 12/13/19 1916 12/13/19 1916 12/14/19 0342 12/14/19 0342 12/14/19 1618 12/14/19 2053 12/15/19 0146 12/16/19 0428 12/18/19 0054  WBC 12.2*   < > 9.6   < > 11.0*  --  11.4* 9.5 7.0  LATICACIDVEN 1.8  --  1.7  --  0.8 0.7  --   --   --    < > = values in this interval not displayed.    Microbiology Recent Results (from the past 240 hour(s))  COVID-19, Flu A+B and RSV ([facility])     Status: None   Collection Time: 12/11/19  6:39 PM  Result Value Ref Range Status   SARS-CoV-2, NAA Not Detected Not Detected Final    Comment: This nucleic acid amplification test was developed and its performance characteristics determined by [employer]. Nucleic acid amplification tests include RT-PCR and TMA. This test has not been FDA cleared or approved. This test has been authorized by FDA under an Emergency Use Authorization (EUA). This test is only authorized for the duration of time the declaration that circumstances exist justifying the authorization of the emergency use of in vitro diagnostic tests for detection of SARS-CoV-2 virus and/or diagnosis of COVID-19 infection under section 564(b)(1) of the Act, 21 U.S.C. 938HWE-9(H) (1), unless the authorization is terminated or revoked sooner. When diagnostic testing is negative, the possibility of a false negative result should be considered in the context of a patient's recent exposures and the presence of clinical signs and symptoms consistent with COVID-19. An individual without symptoms of COVID-19 and who is not shedding SARS-CoV-2 virus wo uld expect to have a negative (not detected) result in this assay.    Influenza A, NAA Not Detected Not  Detected Final   Influenza B, NAA Not Detected Not Detected Final   RSV, NAA Not Detected Not Detected Final  Culture, blood (Routine x 2)     Status: None (Preliminary result)   Collection Time: 12/13/19  7:17 PM   Specimen: BLOOD  Result Value Ref Range Status   Specimen Description BLOOD SITE NOT SPECIFIED  Final   Special Requests   Final    BOTTLES DRAWN AEROBIC AND ANAEROBIC Blood Culture adequate volume   Culture   Final    NO GROWTH 4 DAYS Performed at [facility], [address] [place], [place] [address]    Report Status PENDING  Incomplete  Culture, blood (Routine x 2)     Status: None (Preliminary result)   Collection Time: 12/13/19  9:10 PM   Specimen: BLOOD  Result Value Ref Range Status   Specimen Description BLOOD RIGHT ANTECUBITAL  Final   Special Requests   Final    BOTTLES DRAWN AEROBIC AND ANAEROBIC Blood Culture adequate volume   Culture   Final    NO GROWTH 4 DAYS Performed at [facility], [address] [place], [place] [address]    Report Status PENDING  Incomplete  Culture, Urine     Status: Abnormal   Collection Time: 12/13/19 10:19 PM   Specimen: Urine, Random  Result Value Ref Range Status   Specimen Description URINE, RANDOM  Final   Special Requests   Final    ADDED 0004 12/14/2019 Performed at [facility], [address]  [place], [place] [address]    Culture >=100,000 COLONIES/mL ESCHERICHIA COLI (A)  Final   Report Status 12/16/2019 FINAL  Final   Organism ID, Bacteria ESCHERICHIA COLI (A)  Final      Susceptibility   Escherichia coli - MIC*    AMPICILLIN <=2 SENSITIVE Sensitive     CEFAZOLIN <=4 SENSITIVE Sensitive     CEFEPIME <=0.12 SENSITIVE Sensitive     CEFTRIAXONE <=0.25 SENSITIVE Sensitive     CIPROFLOXACIN <=0.25 SENSITIVE Sensitive     GENTAMICIN <=1 SENSITIVE Sensitive     IMIPENEM <=0.25 SENSITIVE Sensitive     NITROFURANTOIN <=16 SENSITIVE Sensitive     TRIMETH/SULFA <=20 SENSITIVE Sensitive      AMPICILLIN/SULBACTAM <=2 SENSITIVE Sensitive     PIP/TAZO <=4 SENSITIVE Sensitive     * >=100,000 COLONIES/mL ESCHERICHIA COLI  Respiratory Panel by RT PCR (Flu A&B, Covid) - Nasopharyngeal Swab     Status: None   Collection Time: 12/13/19 11:04 PM   Specimen: Nasopharyngeal Swab  Result Value Ref Range Status   SARS Coronavirus 2 by RT PCR NEGATIVE NEGATIVE Final    Comment: (NOTE) SARS-CoV-2 target nucleic acids are NOT DETECTED.  The SARS-CoV-2 RNA is generally detectable in upper respiratoy specimens during the acute phase of infection. The lowest concentration of SARS-CoV-2 viral copies this assay can detect is 131 copies/mL. A negative result does not preclude SARS-Cov-2 infection and should not be used as the sole basis for treatment or other patient management decisions. A negative result may occur with  improper specimen collection/handling, submission of specimen other than nasopharyngeal swab, presence of viral mutation(s) within the areas targeted by this assay, and inadequate number of viral copies (<131 copies/mL). A negative result must be combined with clinical observations, patient history, and epidemiological information. The expected result is Negative.  Fact Sheet for Patients:  https://www.moore.com/  Fact Sheet for Healthcare Providers:  https://www.young.biz/  This test is no t yet approved or cleared by the [place] FDA and  has been authorized for detection and/or diagnosis of SARS-CoV-2 by FDA under an Emergency Use Authorization (EUA). This EUA will remain  in effect (meaning this test can be used) for the duration of the COVID-19 declaration under Section 564(b)(1) of the Act, 21 U.S.C. section 360bbb-3(b)(1), unless the authorization is terminated or revoked sooner.     Influenza A by PCR NEGATIVE NEGATIVE Final   Influenza B by PCR NEGATIVE NEGATIVE Final    Comment: (NOTE) The Xpert Xpress  SARS-CoV-2/FLU/RSV assay is intended as an aid in  the diagnosis of influenza from Nasopharyngeal swab specimens and  should not be used as a sole basis for treatment. Nasal washings and  aspirates are unacceptable for Xpert Xpress SARS-CoV-2/FLU/RSV  testing.  Fact Sheet for Patients: https://www.moore.com/  Fact Sheet for Healthcare Providers: https://www.young.biz/  This test is not yet approved or cleared by the [place] FDA and  has been authorized for detection and/or diagnosis of SARS-CoV-2 by  FDA under an Emergency Use Authorization (EUA). This EUA will remain  in effect (meaning this test can be used) for the duration of the  Covid-19 declaration under Section 564(b)(1) of the Act, 21  U.S.C. section 360bbb-3(b)(1), unless the authorization is  terminated or revoked. Performed at [facility], [address], [place], [place] [address]   MRSA PCR Screening     Status: None   Collection Time: 12/14/19  7:07 AM   Specimen: Nasal Mucosa; Nasopharyngeal  Result Value Ref Range Status  MRSA by PCR NEGATIVE NEGATIVE Final    Comment:        The GeneXpert MRSA Assay (FDA approved for NASAL specimens only), is one component of a comprehensive MRSA colonization surveillance program. It is not intended to diagnose MRSA infection nor to guide or monitor treatment for MRSA infections. Performed at [facility], [address] [place], [place] [address]     Procedures and diagnostic studies:  No results found.  Medications:   . azithromycin  500 mg Oral Daily  . Chlorhexidine Gluconate Cloth  6 each Topical Daily  . enoxaparin (LOVENOX) injection  40 mg Subcutaneous Q24H  . fluconazole  150 mg Oral Once   Continuous Infusions: . sodium chloride 0 mL/hr at 12/15/19 0057  . cefTRIAXone (ROCEPHIN)  IV 1 g (12/17/19 1914)     LOS: 4 days   Joseph Art  [facility]   How to contact the [facility]  Attending or Consulting provider 7A - 7P or covering provider during after hours 7P -7A, for this patient?  1. Check the care team in [facility] and look for a) attending/consulting TRH provider listed and b) the [facility] team listed 2. Log into [email or web address] and use [place]'s universal password to access. If you do not have the password, please contact the hospital operator. 3. Locate the [facility] provider you are looking for under [facility] and page to a number that you can be directly reached. 4. If you still have difficulty reaching the provider, please page the [employer] (Director on Call) for the Hospitalists listed on amion for assistance.  12/18/2019, 10:50 AM

## 2019-12-18 NOTE — TOC Initial Note (Signed)
Transition of Care [facility]) - Initial/Assessment Note    Patient Details  Name: Sonia Manning MRN: [id number] Date of Birth: February 28, 1995  Transition [facility]) CM/SW Contact:    Kingsley Plan, RN Phone Number: 12/18/2019, 2:01 PM  Clinical Narrative:                  Spoke to patient at bedside.   PCP is at [facility].   Possible discharge tomorrow. MD will send scripts to [facility]. NCM will assist in getting scripts filled.  Expected Discharge Plan: Home/Self Care Barriers to Discharge: Continued Medical Work up   Patient Goals and CMS Choice Patient states their goals for this hospitalization and ongoing recovery are:: to return to home CMS Medicare.gov Compare Post Acute Care list provided to:: Patient Choice offered to / list presented to : NA  Expected Discharge Plan and Services Expected Discharge Plan: Home/Self Care   Discharge Planning Services: CM Consult, Medication Assistance   Living arrangements for the past 2 months: Single Family Home                   DME Agency: NA       HH Arranged: NA          Prior Living Arrangements/Services Living arrangements for the past 2 months: Single Family Home Lives with:: Parents Patient language and need for interpreter reviewed:: Yes Do you feel safe going back to the place where you live?: Yes      Need for Family Participation in Patient Care: Yes (Comment) Care giver support system in place?: Yes (comment)   Criminal Activity/Legal Involvement Pertinent to Current Situation/Hospitalization: No - Comment as needed  Activities of Daily Living      Permission Sought/Granted   Permission granted to share information with : No              Emotional Assessment Appearance:: Appears stated age Attitude/Demeanor/Rapport: Engaged Affect (typically observed): Accepting Orientation: : Oriented to Self, Oriented to Place, Oriented to  Time, Oriented to Situation Alcohol  / Substance Use: Not Applicable Psych Involvement: No (comment)  Admission diagnosis:  Shortness of breath [R06.02] Acute cystitis without hematuria [N30.00] Kidney lesion, native, right [N28.9] Sepsis due to urinary tract infection (HCC) [A41.9, N39.0] Sepsis (HCC) [A41.9] Patient Active Problem List   Diagnosis Date Noted  . Sepsis due to urinary tract infection (HCC) 12/14/2019  . Mild renal insufficiency 12/14/2019  . Lung nodule 12/14/2019  . Hyponatremia 12/14/2019  . Kidney lesion, native, right 12/14/2019  . Sepsis (HCC) 12/14/2019   PCP:  Dettinger, Elige Radon, MD Pharmacy:   [phone] [place], [place] - [address] [place] [address] Phone: [phone] Fax: [phone]  [facility] [place], [place] - [address] [place] [address] Phone: [phone] Fax: [phone]     Social Determinants of Health (SDOH) Interventions    Readmission Risk Interventions No flowsheet data found.

## 2019-12-19 NOTE — Progress Notes (Signed)
Sonia Manning to be D/C'd per MD order. Discussed with the patient and all questions fully answered. ? VSS, Skin clean, dry and intact without evidence of skin break down, no evidence of skin tears noted. ? IV catheter discontinued intact. Site without signs and symptoms of complications. Dressing and pressure applied. ? An After Visit Summary was printed and given to the patient. Patient informed where to pickup prescriptions. ? D/c education completed with patient/family including follow up instructions, medication list, d/c activities limitations if indicated, with other d/c instructions as indicated by MD - patient able to verbalize understanding, all questions fully answered.  ? Patient instructed to return to ED, call 911, or call MD for any changes in condition.  ? Patient to be escorted via WC, and D/C home via private auto.

## 2019-12-19 NOTE — Discharge Summary (Signed)
Physician Discharge Summary  Sonia Manning IEP:[id number] DOB: 1995-07-31 DOA: 12/13/2019  PCP: Dettinger, Elige Radon, MD  Admit date: 12/13/2019 Discharge date: 12/19/2019  Admitted From: home Discharge disposition: home   Recommendations for Outpatient Follow-Up:   1. Follow lung nodules   Discharge Diagnosis:   Principal Problem:   Sepsis due to urinary tract infection (HCC) Active Problems:   Mild renal insufficiency   Lung nodule   Hyponatremia   Kidney lesion, native, right   Sepsis [facility])    Discharge Condition: Improved.  Diet recommendation: .  Regular.  Wound care: None.  Code status: Full.   History of Present Illness:   Sonia Manning is a 24 y.o. female who denies any significant past medical history now presents emergency department for evaluation of fevers, cough, aches, right flank pain, and suprapubic discomfort.  Patient has had several days of nonproductive cough and then developed fevers, flank pain, and suprapubic discomfort 3 days ago.  She has not been particularly short of breath and denied dysuria or gross hematuria.  She had negative COVID-19, RSV, and influenza testing on 12/11/2019 and was seen in the [facility] emergency department on 12/12/2019 with negative chest x-ray and stable vital signs.  She has since developed worsening flank pain, continues to have high fevers, and returns to the ED tonight for that reason.   Hospital Course by Problem:    Septic shock secondary to acute pyelonephritis, urinary tract infectionwith pansensitive E. coli, (POA ) plus atypical PNA Patient received multiple IV fluid boluses, then was on norepinephrine infusion for more than 24 hours  off vasopressors -blood cultures negative Urine culture grew pansensitive E. coli, meropenem was switched to ceftriaxone to complete total of 7 days therapy (plant o switch to PO to finish course if taking in orals consistently)  Continueazithromycin for  possible atypical pneumonia as patient has dry cough, CT chest did not show any infiltrate which could be due to atypical presentation -renal U/S: 1.8 cm interpolar right renal cyst. No solid renal mass  Epistaxis ENT consulted, patient was noted to have active bleeding from nasal septum, it was cauterized with silver nitrate Continue Vaseline ointment in both nares twice daily for 10 days  Nausea/vomiting -resolved  Vaginal yeast infection -dilflucan PO x 1 -will also use topical meds  Small pulmonary nodules in the left lower lobe, the largest of which measures 5 mm. No follow-up needed if patient is low-risk. Non-contrast chest CT can be considered in 12 months if patient is high-risk.   Medical Consultants:   PCCM   Discharge Exam:   Vitals:   12/18/19 2015 12/19/19 0338  BP: 98/67 99/65  Pulse: 75 76  Resp: 18 18  Temp: 98.3 F (36.8 C) 98.4 F (36.9 C)  SpO2: 100% 100%   Vitals:   12/18/19 0418 12/18/19 1303 12/18/19 2015 12/19/19 0338  BP: 95/65 100/66 98/67 99/65   Pulse: 84 74 75 76  Resp: 17 18 18 18   Temp: 99.3 F (37.4 C) 98.5 F (36.9 C) 98.3 F (36.8 C) 98.4 F (36.9 C)  TempSrc: Oral Oral Oral Oral  SpO2: 100% 100% 100% 100%  Weight:      Height:        General exam: Appears calm and comfortable.   The results of significant diagnostics from this hospitalization (including imaging, microbiology, ancillary and laboratory) are listed below for reference.     Procedures and Diagnostic Studies:   DG Chest 2 View  Result Date:  12/13/2019 CLINICAL DATA:  Sepsis. Shortness of breath with upper abdominal pain. EXAM: CHEST - 2 VIEW COMPARISON:  December 12, 2019 FINDINGS: The heart size and mediastinal contours are within normal limits. Both lungs are clear. The visualized skeletal structures are unremarkable. IMPRESSION: No active cardiopulmonary disease. Electronically Signed   By: Katherine Mantle M.D.   On: 12/13/2019 19:35   CT Angio  Chest PE W and/or Wo Contrast  Result Date: 12/14/2019 CLINICAL DATA:  Shortness of breath and upper abdominal pain x3 days. Fever but negative COVID test. EXAM: CT ANGIOGRAPHY CHEST CT ABDOMEN AND PELVIS WITH CONTRAST TECHNIQUE: Multidetector CT imaging of the chest was performed using the standard protocol during bolus administration of intravenous contrast. Multiplanar CT image reconstructions and MIPs were obtained to evaluate the vascular anatomy. Multidetector CT imaging of the abdomen and pelvis was performed using the standard protocol during bolus administration of intravenous contrast. CONTRAST:  OMNIPAQUE IOHEXOL 350 MG/ML SOLN COMPARISON:  None. FINDINGS: CTA CHEST FINDINGS Cardiovascular: Contrast injection is sufficient to demonstrate satisfactory opacification of the pulmonary arteries to the segmental level. There is no pulmonary embolus or evidence of right heart strain. The size of the main pulmonary artery is normal. Heart size is normal, with no pericardial effusion. The course and caliber of the aorta are normal. There is no atherosclerotic calcification. Opacification decreased due to pulmonary arterial phase contrast bolus timing. Mediastinum/Nodes: -- No mediastinal lymphadenopathy. -- No hilar lymphadenopathy. -- No axillary lymphadenopathy. -- No supraclavicular lymphadenopathy. -- Normal thyroid gland where visualized. -  Unremarkable esophagus. Lungs/Pleura: There is a 5 mm pulmonary nodule in the left lower lobe (axial series 6, image 91). There is additional smaller 3-4 mm pulmonary nodule in the peripheral left lower lobe (axial series 6, image 93). Musculoskeletal: No chest wall abnormality. No bony spinal canal stenosis. CT ABDOMEN and PELVIS FINDINGS Hepatobiliary: The liver is normal. Normal gallbladder.There is no biliary ductal dilation. Pancreas: Normal contours without ductal dilatation. No peripancreatic fluid collection. Spleen: Unremarkable. Adrenals/Urinary Tract:  --Adrenal glands: Unremarkable. --Right kidney/ureter: There is a complex appearing, irregular cystic structure in the right kidney measuring approximately 1.9 cm. This structure measures approximately 36 Hounsfield units. There is no right-sided hydronephrosis. There is some mild enhancement throughout the right ureter. --Left kidney/ureter: There is a striated appearance of the left kidney without evidence for hydronephrosis. There is diffuse enhancement throughout the left ureter. --Urinary bladder: There is mild wall thickening of the urinary bladder with adjacent fat stranding. Stomach/Bowel: --Stomach/Duodenum: No hiatal hernia or other gastric abnormality. Normal duodenal course and caliber. --Small bowel: Unremarkable. --Colon: Unremarkable. --Appendix: Normal. Vascular/Lymphatic: Normal course and caliber of the major abdominal vessels. There is probable mixing artifact in the bilateral common femoral veins. --No retroperitoneal lymphadenopathy. --No mesenteric lymphadenopathy. --No pelvic or inguinal lymphadenopathy. Reproductive: There appears to be some fluid in the vaginal canal and lower uterine segment. Other: No ascites or free air. The abdominal wall is normal. Musculoskeletal. 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 Review of the MIP images confirms the above findings. IMPRESSION: 1. No acute pulmonary embolism. 2. Striated appearance of the left kidney, concerning for pyelonephritis. 3. There is a complex appearing cystic structure in the right kidney measuring approximately 1.9 cm. This is favored to represent a proteinaceous or hemorrhagic cyst. An abscess is felt to be less likely given the lack of findings suggestive of right-sided pyelonephritis. A follow-up nonemergent renal ultrasound is recommended for further evaluation of this finding. 4. There  appears to be some fluid in the vaginal canal and lower uterine segment. Correlation with patient's symptoms and  menstrual cycle is recommended. 5. 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 6. Small pulmonary nodules in the left lower lobe, the largest of which measures 5 mm. No follow-up needed if patient is low-risk. Non-contrast chest CT can be considered in 12 months if patient is high-risk. This recommendation follows the consensus statement: Guidelines for Management of Incidental Pulmonary Nodules Detected on CT Images: From the Fleischner Society 2017; Radiology 2017; 284:228-243. Electronically Signed   By: Katherine Mantle M.D.   On: 12/14/2019 00:23   CT ABDOMEN PELVIS W CONTRAST  Result Date: 12/14/2019 CLINICAL DATA:  Shortness of breath and upper abdominal pain x3 days. Fever but negative COVID test. EXAM: CT ANGIOGRAPHY CHEST CT ABDOMEN AND PELVIS WITH CONTRAST TECHNIQUE: Multidetector CT imaging of the chest was performed using the standard protocol during bolus administration of intravenous contrast. Multiplanar CT image reconstructions and MIPs were obtained to evaluate the vascular anatomy. Multidetector CT imaging of the abdomen and pelvis was performed using the standard protocol during bolus administration of intravenous contrast. CONTRAST:  OMNIPAQUE IOHEXOL 350 MG/ML SOLN COMPARISON:  None. FINDINGS: CTA CHEST FINDINGS Cardiovascular: Contrast injection is sufficient to demonstrate satisfactory opacification of the pulmonary arteries to the segmental level. There is no pulmonary embolus or evidence of right heart strain. The size of the main pulmonary artery is normal. Heart size is normal, with no pericardial effusion. The course and caliber of the aorta are normal. There is no atherosclerotic calcification. Opacification decreased due to pulmonary arterial phase contrast bolus timing. Mediastinum/Nodes: -- No mediastinal lymphadenopathy. -- No hilar lymphadenopathy. -- No axillary lymphadenopathy. -- No supraclavicular lymphadenopathy. -- Normal thyroid gland where  visualized. -  Unremarkable esophagus. Lungs/Pleura: There is a 5 mm pulmonary nodule in the left lower lobe (axial series 6, image 91). There is additional smaller 3-4 mm pulmonary nodule in the peripheral left lower lobe (axial series 6, image 93). Musculoskeletal: No chest wall abnormality. No bony spinal canal stenosis. CT ABDOMEN and PELVIS FINDINGS Hepatobiliary: The liver is normal. Normal gallbladder.There is no biliary ductal dilation. Pancreas: Normal contours without ductal dilatation. No peripancreatic fluid collection. Spleen: Unremarkable. Adrenals/Urinary Tract: --Adrenal glands: Unremarkable. --Right kidney/ureter: There is a complex appearing, irregular cystic structure in the right kidney measuring approximately 1.9 cm. This structure measures approximately 36 Hounsfield units. There is no right-sided hydronephrosis. There is some mild enhancement throughout the right ureter. --Left kidney/ureter: There is a striated appearance of the left kidney without evidence for hydronephrosis. There is diffuse enhancement throughout the left ureter. --Urinary bladder: There is mild wall thickening of the urinary bladder with adjacent fat stranding. Stomach/Bowel: --Stomach/Duodenum: No hiatal hernia or other gastric abnormality. Normal duodenal course and caliber. --Small bowel: Unremarkable. --Colon: Unremarkable. --Appendix: Normal. Vascular/Lymphatic: Normal course and caliber of the major abdominal vessels. There is probable mixing artifact in the bilateral common femoral veins. --No retroperitoneal lymphadenopathy. --No mesenteric lymphadenopathy. --No pelvic or inguinal lymphadenopathy. Reproductive: There appears to be some fluid in the vaginal canal and lower uterine segment. Other: No ascites or free air. The abdominal wall is normal. Musculoskeletal. 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 Review of the MIP images confirms the above findings. IMPRESSION: 1. No acute  pulmonary embolism. 2. Striated appearance of the left kidney, concerning for pyelonephritis. 3. There is a complex appearing cystic structure in the right kidney measuring approximately 1.9 cm.  This is favored to represent a proteinaceous or hemorrhagic cyst. An abscess is felt to be less likely given the lack of findings suggestive of right-sided pyelonephritis. A follow-up nonemergent renal ultrasound is recommended for further evaluation of this finding. 4. There appears to be some fluid in the vaginal canal and lower uterine segment. Correlation with patient's symptoms and menstrual cycle is recommended. 5. 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 6. Small pulmonary nodules in the left lower lobe, the largest of which measures 5 mm. No follow-up needed if patient is low-risk. Non-contrast chest CT can be considered in 12 months if patient is high-risk. This recommendation follows the consensus statement: Guidelines for Management of Incidental Pulmonary Nodules Detected on CT Images: From the Fleischner Society 2017; Radiology 2017; 284:228-243. Electronically Signed   By: Katherine Mantle M.D.   On: 12/14/2019 00:23   US RENAL  Result Date: 12/14/2019 CLINICAL DATA:  Right renal lesion EXAM: RENAL / URINARY TRACT ULTRASOUND COMPLETE COMPARISON:  CT abdomen pelvis 12/13/2019 FINDINGS: Right Kidney: Renal measurements: 10.8 x 4.0 x 5.8 cm = volume: 129 mL. 1.8 cm interpolar cyst corresponds to the lesion identified on the earlier CT. There are low level echoes within the ureter. No hydronephrosis. Left Kidney: Renal measurements: 11.2 x 5.8 x 4.8 cm = volume: 162 mL. No hydronephrosis or solid renal mass. There are low level echoes in the ureter. Bladder: Low level echoes in the bladder.  Both ureteral jets were seen. Other: None. IMPRESSION: 1. 1.8 cm interpolar right renal cyst. No solid renal mass. 2. Low-level echoes in the ureters and bladder, which may represent debris; however,  this finding may also be artifactual. Correlate with urinalysis. Electronically Signed   By: Deatra Robinson M.D.   On: 12/14/2019 03:05     Labs:   Basic Metabolic Panel: Recent Labs  Lab 12/14/19 0342 12/14/19 0342 12/15/19 0146 12/15/19 0146 12/15/19 0722 12/15/19 0722 12/16/19 0428 12/18/19 0054  NA 136  --  132*  --  134*  --  138 139  K 3.2*   < > 4.1   < > 4.7   < > 4.1 3.7  CL 103  --  105  --  104  --  104 104  CO2 22  --  21*  --  20*  --  27 27  GLUCOSE 117*  --  120*  --  88  --  97 97  BUN 13  --  7  --  7  --  6 <5*  CREATININE 1.20*  --  0.96  --  0.90  --  0.78 0.61  CALCIUM 8.0*  --  7.9*  --  8.4*  --  8.3* 8.8*  MG  --   --  1.7  --   --   --  2.1  --    < > = values in this interval not displayed.   GFR Estimated Creatinine Clearance: 94.4 mL/min (by C-G formula based on SCr of 0.61 mg/dL). Liver Function Tests: Recent Labs  Lab 12/13/19 1916 12/15/19 0146  AST 19 20  ALT 14 16  ALKPHOS 57 38  BILITOT 1.2 0.7  PROT 7.2 5.2*  ALBUMIN 3.6 2.4*   Recent Labs  Lab 12/13/19 2051  LIPASE 28   No results for input(s): AMMONIA in the last 168 hours. Coagulation profile Recent Labs  Lab 12/13/19 1916 12/14/19 0706 12/18/19 0054  INR 1.1 1.4* 1.0    CBC: Recent Labs  Lab 12/13/19  1916 12/13/19 1916 12/14/19 0342 12/14/19 1618 12/15/19 0146 12/16/19 0428 12/18/19 0054  WBC 12.2*   < > 9.6 11.0* 11.4* 9.5 7.0  NEUTROABS 9.8*  --  7.9* 9.5* 8.1* 6.7  --   HGB 12.2   < > 9.7* 9.0* 9.0* 9.8* 9.8*  HCT 35.7*   < > 29.2* 26.7* 27.0* 29.7* 30.1*  MCV 86.0   < > 89.3 87.0 87.9 87.4 86.7  PLT 148*   < > 105* PLATELET CLUMPS NOTED ON SMEAR, UNABLE TO ESTIMATE 110* 149* 229   < > = values in this interval not displayed.   Cardiac Enzymes: No results for input(s): CKTOTAL, CKMB, CKMBINDEX, TROPONINI in the last 168 hours. BNP: Invalid input(s): POCBNP CBG: Recent Labs  Lab 12/14/19 2339  GLUCAP 83   D-Dimer No results for input(s):  DDIMER in the last 72 hours. Hgb A1c No results for input(s): HGBA1C in the last 72 hours. Lipid Profile No results for input(s): CHOL, HDL, LDLCALC, TRIG, CHOLHDL, LDLDIRECT in the last 72 hours. Thyroid function studies No results for input(s): TSH, T4TOTAL, T3FREE, THYROIDAB in the last 72 hours.  Invalid input(s): FREET3 Anemia work up No results for input(s): VITAMINB12, FOLATE, FERRITIN, TIBC, IRON, RETICCTPCT in the last 72 hours. Microbiology Recent Results (from the past 240 hour(s))  COVID-19, Flu A+B and RSV ([facility])     Status: None   Collection Time: 12/11/19  6:39 PM  Result Value Ref Range Status   SARS-CoV-2, NAA Not Detected Not Detected Final    Comment: This nucleic acid amplification test was developed and its performance characteristics determined by [facility]. Nucleic acid amplification tests include RT-PCR and TMA. This test has not been FDA cleared or approved. This test has been authorized by FDA under an Emergency Use Authorization (EUA). This test is only authorized for the duration of time the declaration that circumstances exist justifying the authorization of the emergency use of in vitro diagnostic tests for detection of SARS-CoV-2 virus and/or diagnosis of COVID-19 infection under section 564(b)(1) of the Act, 21 U.S.C. 161WRU-0(A360bbb-3(b) (1), unless the authorization is terminated or revoked sooner. When diagnostic testing is negative, the possibility of a false negative result should be considered in the context of a patient's recent exposures and the presence of clinical signs and symptoms consistent with COVID-19. An individual without symptoms of COVID-19 and who is not shedding SARS-CoV-2 virus wo uld expect to have a negative (not detected) result in this assay.    Influenza A, NAA Not Detected Not Detected Final   Influenza B, NAA Not Detected Not Detected Final   RSV, NAA Not Detected Not Detected Final  Culture, blood (Routine x 2)      Status: None   Collection Time: 12/13/19  7:17 PM   Specimen: BLOOD  Result Value Ref Range Status   Specimen Description BLOOD SITE NOT SPECIFIED  Final   Special Requests   Final    BOTTLES DRAWN AEROBIC AND ANAEROBIC Blood Culture adequate volume   Culture   Final    NO GROWTH 5 DAYS Performed at [facility], [address] [place], [place] [address]    Report Status 12/18/2019 FINAL  Final  Culture, blood (Routine x 2)     Status: None   Collection Time: 12/13/19  9:10 PM   Specimen: BLOOD  Result Value Ref Range Status   Specimen Description BLOOD RIGHT ANTECUBITAL  Final   Special Requests   Final    BOTTLES DRAWN  AEROBIC AND ANAEROBIC Blood Culture adequate volume   Culture   Final    NO GROWTH 5 DAYS Performed at [facility], [address], [place], [place] [address]    Report Status 12/18/2019 FINAL  Final  Culture, Urine     Status: Abnormal   Collection Time: 12/13/19 10:19 PM   Specimen: Urine, Random  Result Value Ref Range Status   Specimen Description URINE, RANDOM  Final   Special Requests   Final    ADDED 0004 12/14/2019 Performed at [facility], [address], [place], [place] [address]    Culture >=100,000 COLONIES/mL ESCHERICHIA COLI (A)  Final   Report Status 12/16/2019 FINAL  Final   Organism ID, Bacteria ESCHERICHIA COLI (A)  Final      Susceptibility   Escherichia coli - MIC*    AMPICILLIN <=2 SENSITIVE Sensitive     CEFAZOLIN <=4 SENSITIVE Sensitive     CEFEPIME <=0.12 SENSITIVE Sensitive     CEFTRIAXONE <=0.25 SENSITIVE Sensitive     CIPROFLOXACIN <=0.25 SENSITIVE Sensitive     GENTAMICIN <=1 SENSITIVE Sensitive     IMIPENEM <=0.25 SENSITIVE Sensitive     NITROFURANTOIN <=16 SENSITIVE Sensitive     TRIMETH/SULFA <=20 SENSITIVE Sensitive     AMPICILLIN/SULBACTAM <=2 SENSITIVE Sensitive     PIP/TAZO <=4 SENSITIVE Sensitive     * >=100,000 COLONIES/mL ESCHERICHIA COLI  Respiratory Panel by RT PCR (Flu A&B, Covid) -  Nasopharyngeal Swab     Status: None   Collection Time: 12/13/19 11:04 PM   Specimen: Nasopharyngeal Swab  Result Value Ref Range Status   SARS Coronavirus 2 by RT PCR NEGATIVE NEGATIVE Final    Comment: (NOTE) SARS-CoV-2 target nucleic acids are NOT DETECTED.  The SARS-CoV-2 RNA is generally detectable in upper respiratoy specimens during the acute phase of infection. The lowest concentration of SARS-CoV-2 viral copies this assay can detect is 131 copies/mL. A negative result does not preclude SARS-Cov-2 infection and should not be used as the sole basis for treatment or other patient management decisions. A negative result may occur with  improper specimen collection/handling, submission of specimen other than nasopharyngeal swab, presence of viral mutation(s) within the areas targeted by this assay, and inadequate number of viral copies (<131 copies/mL). A negative result must be combined with clinical observations, patient history, and epidemiological information. The expected result is Negative.  Fact Sheet for Patients:  https://www.moore.com/  Fact Sheet for Healthcare Providers:  https://www.young.biz/  This test is no t yet approved or cleared by the [place] FDA and  has been authorized for detection and/or diagnosis of SARS-CoV-2 by FDA under an Emergency Use Authorization (EUA). This EUA will remain  in effect (meaning this test can be used) for the duration of the COVID-19 declaration under Section 564(b)(1) of the Act, 21 U.S.C. section 360bbb-3(b)(1), unless the authorization is terminated or revoked sooner.     Influenza A by PCR NEGATIVE NEGATIVE Final   Influenza B by PCR NEGATIVE NEGATIVE Final    Comment: (NOTE) The Xpert Xpress SARS-CoV-2/FLU/RSV assay is intended as an aid in  the diagnosis of influenza from Nasopharyngeal swab specimens and  should not be used as a sole basis for treatment. Nasal washings and   aspirates are unacceptable for Xpert Xpress SARS-CoV-2/FLU/RSV  testing.  Fact Sheet for Patients: https://www.moore.com/  Fact Sheet for Healthcare Providers: https://www.young.biz/  This test is not yet approved or cleared by the [place] and  has been authorized  for detection and/or diagnosis of SARS-CoV-2 by  FDA under an Emergency Use Authorization (EUA). This EUA will remain  in effect (meaning this test can be used) for the duration of the  Covid-19 declaration under Section 564(b)(1) of the Act, 21  U.S.C. section 360bbb-3(b)(1), unless the authorization is  terminated or revoked. Performed at [facility], [address], [place], [place] [address]   MRSA PCR Screening     Status: None   Collection Time: 12/14/19  7:07 AM   Specimen: Nasal Mucosa; Nasopharyngeal  Result Value Ref Range Status   MRSA by PCR NEGATIVE NEGATIVE Final    Comment:        The GeneXpert MRSA Assay (FDA approved for NASAL specimens only), is one component of a comprehensive MRSA colonization surveillance program. It is not intended to diagnose MRSA infection nor to guide or monitor treatment for MRSA infections. Performed at [facility], [address], [place], [place] [address]      Discharge Instructions:   Discharge Instructions    Diet general   Complete by: As directed    Discharge instructions   Complete by: As directed    While on abx would eat yogurt with active culture Can use OTC meds for yeast infection   Increase activity slowly   Complete by: As directed      Allergies as of 12/19/2019   No Known Allergies     Medication List    STOP taking these medications   fluticasone 50 MCG/ACT nasal spray Commonly known as: FLONASE   ibuprofen 200 MG tablet Commonly known as: ADVIL     TAKE these medications   benzonatate 100 MG capsule Commonly known as: TESSALON Take 1 capsule (100 mg total) by  mouth every 8 (eight) hours.   cefdinir 300 MG capsule Commonly known as: OMNICEF Take 1 capsule (300 mg total) by mouth 2 (two) times daily.       Follow-up Information    Dettinger, Elige Radon, MD. Schedule an appointment as soon as possible for a visit.   Specialties: Family Medicine, Cardiology Contact information: [address] [place] [address] [phone]                Time coordinating discharge: 35 min  Signed:  Joseph Art DO  [facility] 12/19/2019, 8:23 AM

## 2021-07-02 IMAGING — DX DG CHEST 1V PORT
1 series · 1 of 1 positions shown · non-contrast
Comparison: None.

CLINICAL DATA: Fever, cough, short of breath

EXAM:
PORTABLE CHEST 1 VIEW

[chest ap]
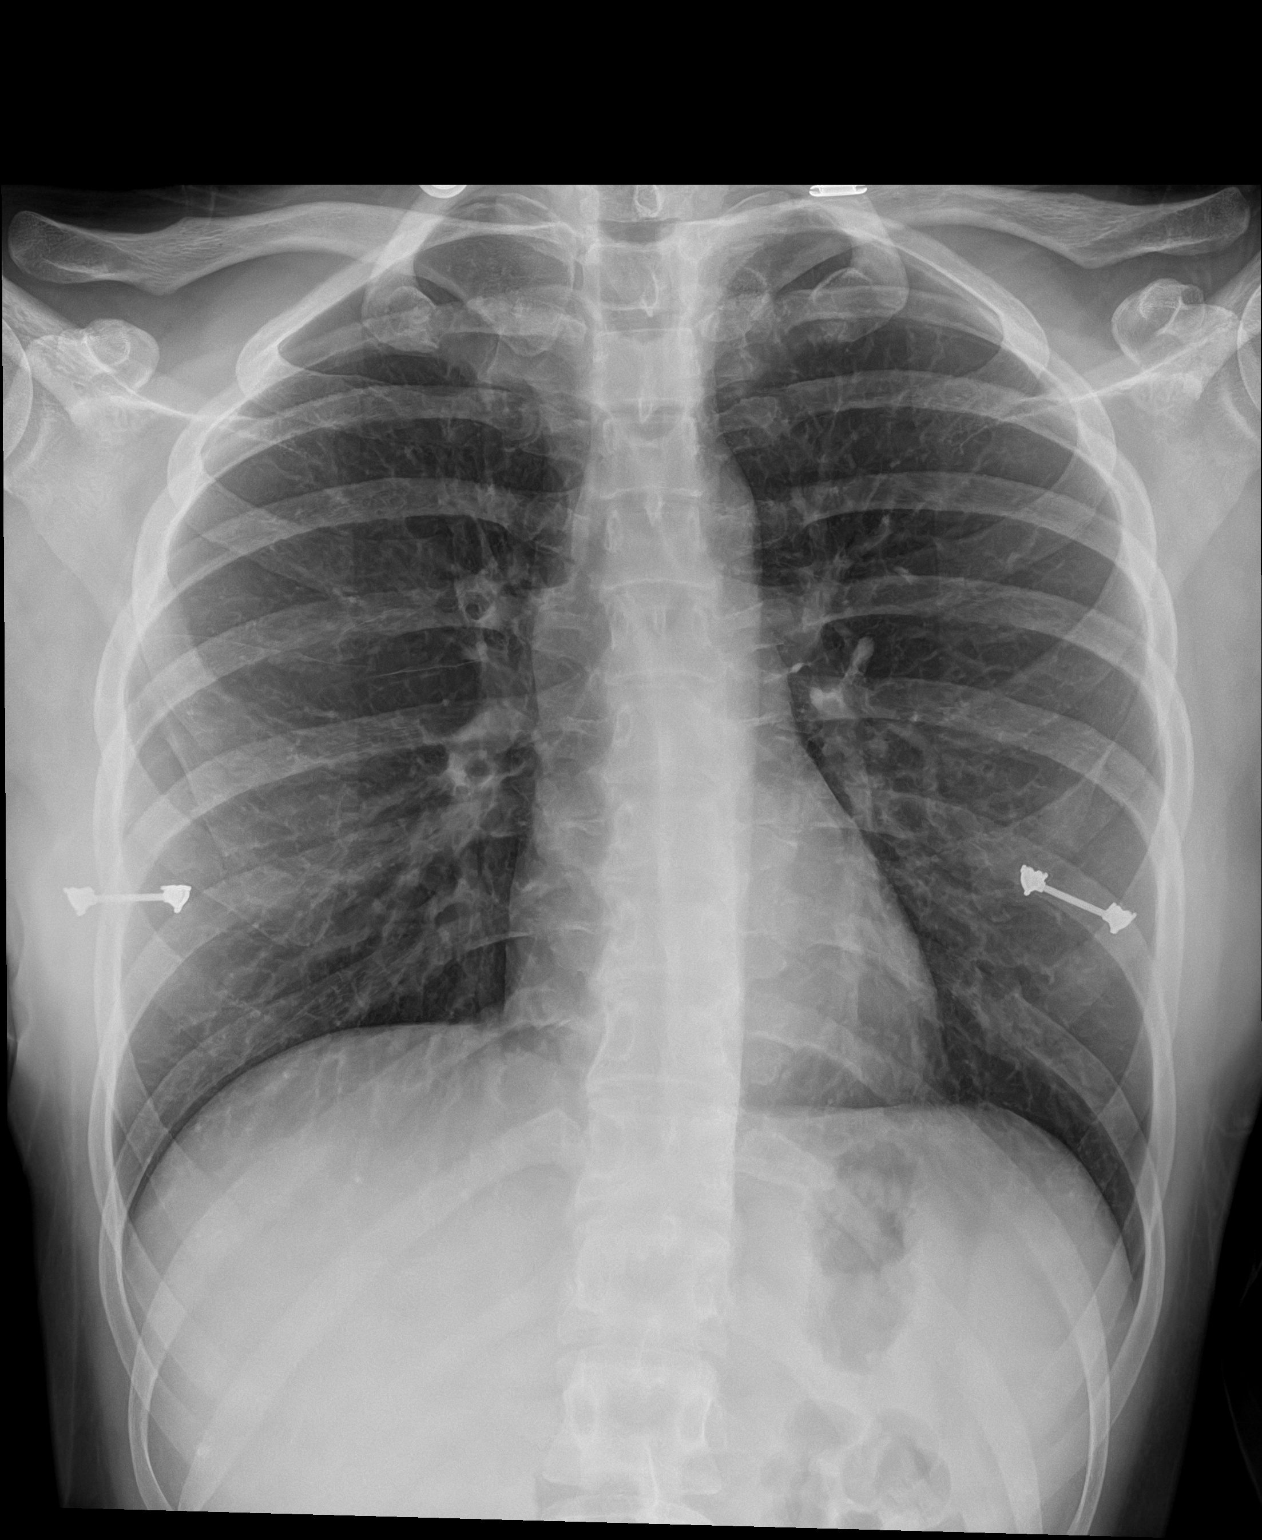

[1 of 1 positions shown; findings below may reference images not displayed]

FINDINGS: The heart size and mediastinal contours are within normal limits.
Both lungs are clear. The visualized skeletal structures are
unremarkable.
IMPRESSION: No active disease.

## 2021-07-03 IMAGING — CT CT ABD-PELV W/ CM
2 of 4 series · 13 of 46 positions shown, 15 images · IV contrast (omnipaque)
Comparison: None.

CLINICAL DATA: Shortness of breath and upper abdominal pain x3
days. Fever but negative COVID test.

EXAM:
CT ANGIOGRAPHY CHEST
CT ABDOMEN AND PELVIS WITH CONTRAST
TECHNIQUE: Multidetector CT imaging of the chest was performed using the
standard protocol during bolus administration of intravenous
contrast. Multiplanar CT image reconstructions and MIPs were
obtained to evaluate the vascular anatomy. Multidetector CT imaging
of the abdomen and pelvis was performed using the standard protocol
during bolus administration of intravenous contrast.
CONTRAST:  100mL OMNIPAQUE IOHEXOL 350 MG/ML SOLN

[Series 12: a/p w/ 5mm · axial · 0.65mm/px · z∈[-406,-11]mm · 10 of 95 slices shown, 12 images]
[im 8/95  soft-tissue]
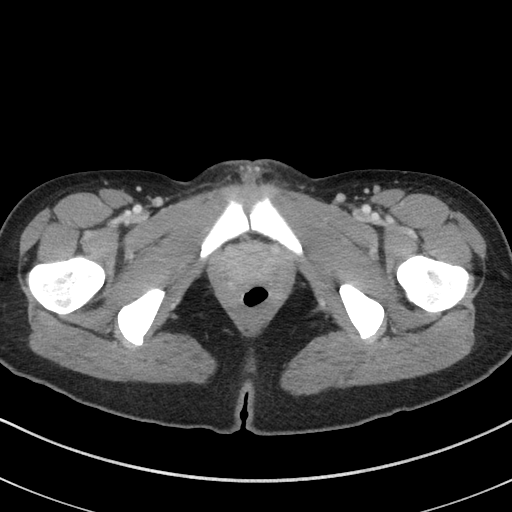
[im 8/95  bone]
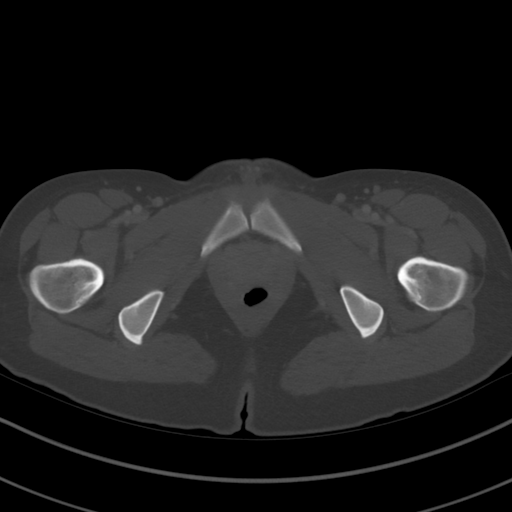
[im 16/95  soft-tissue]
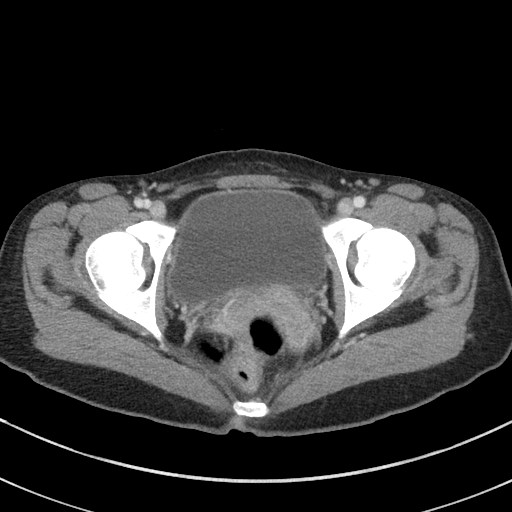
[im 27/95  soft-tissue]
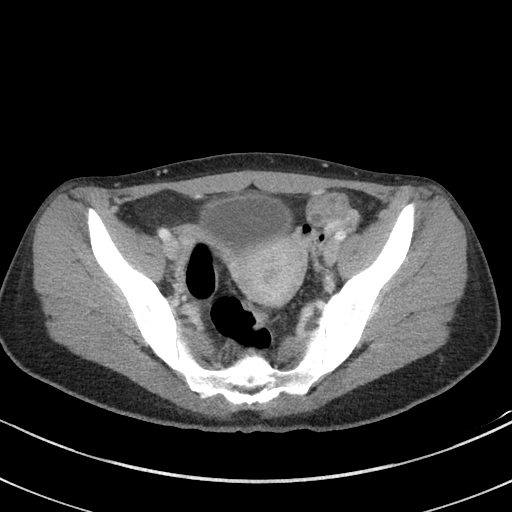
[im 34/95  soft-tissue]
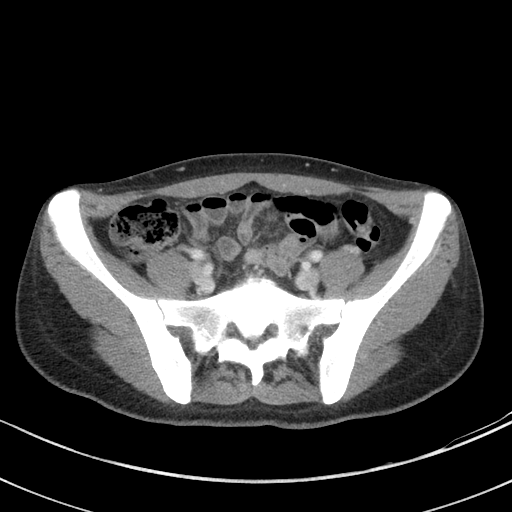
[im 42/95  soft-tissue]
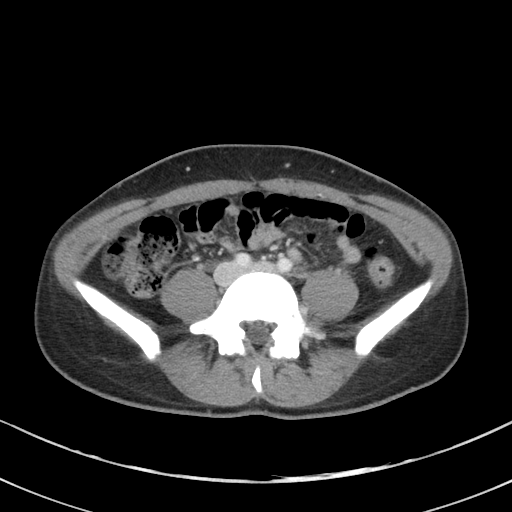
[im 53/95  soft-tissue]
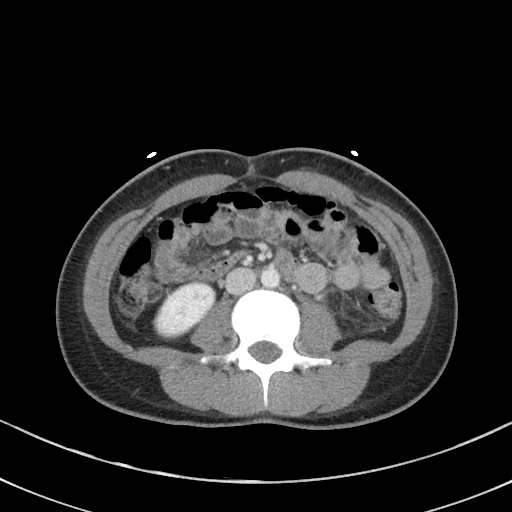
[im 61/95  soft-tissue]
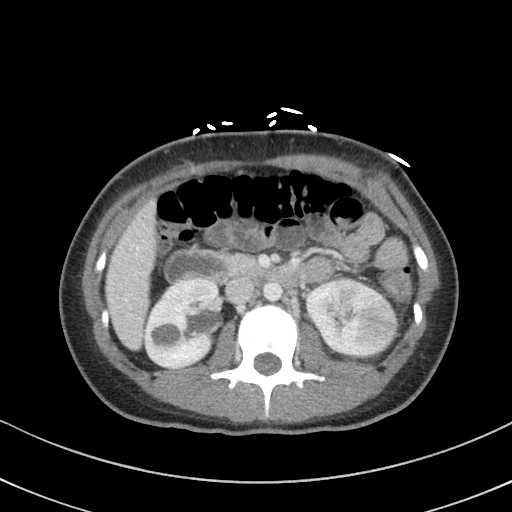
[im 72/95  soft-tissue]
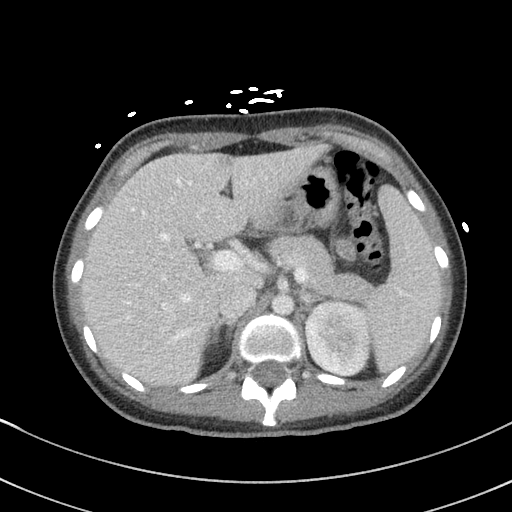
[im 79/95  soft-tissue]
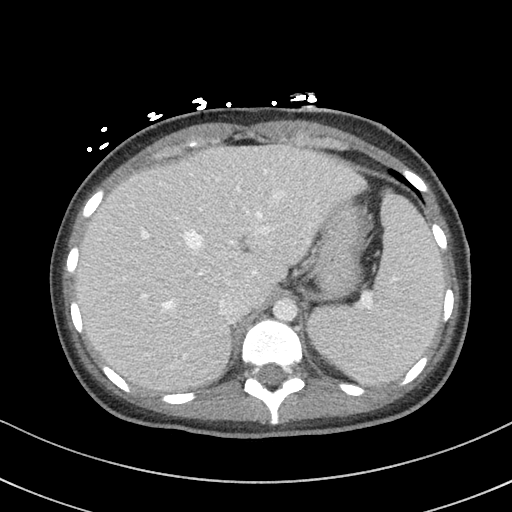
[im 79/95  bone]
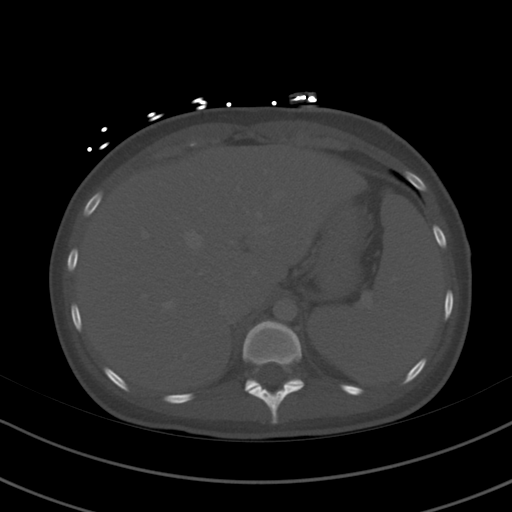
[im 87/95  soft-tissue]
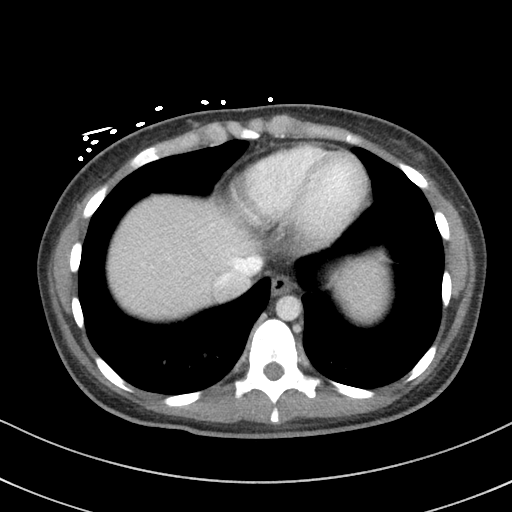

[Series 15: a/p w/ cor · coronal · 0.67mm/px · 3 of 115 slices shown]
[im 39/115  soft-tissue]
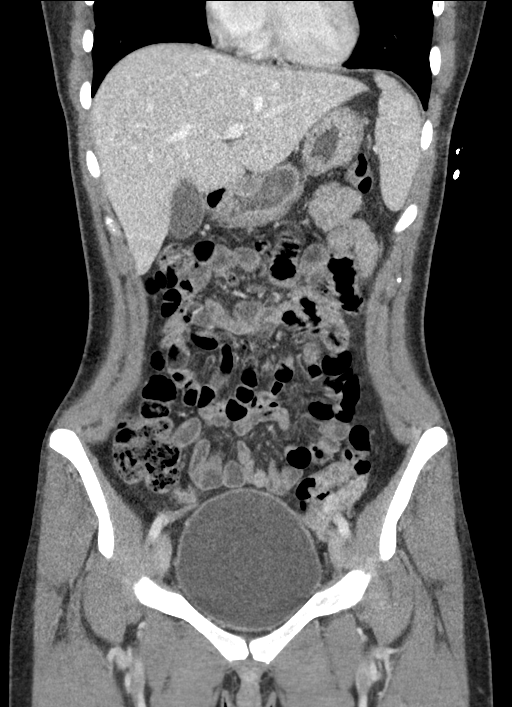
[im 51/115  soft-tissue]
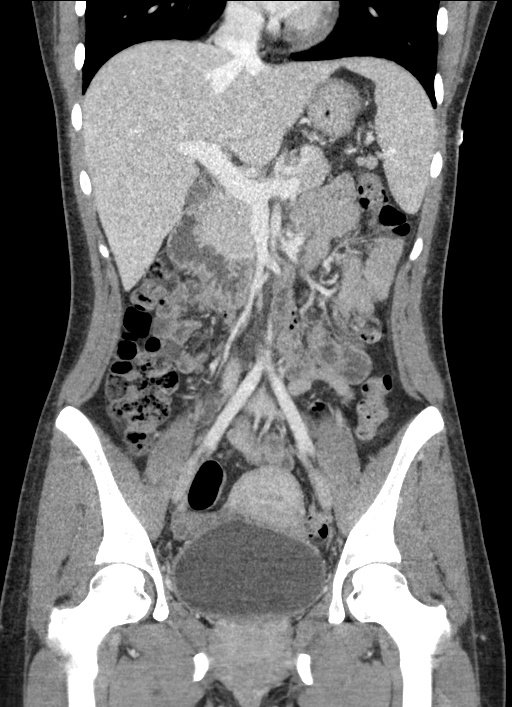
[im 64/115  soft-tissue]
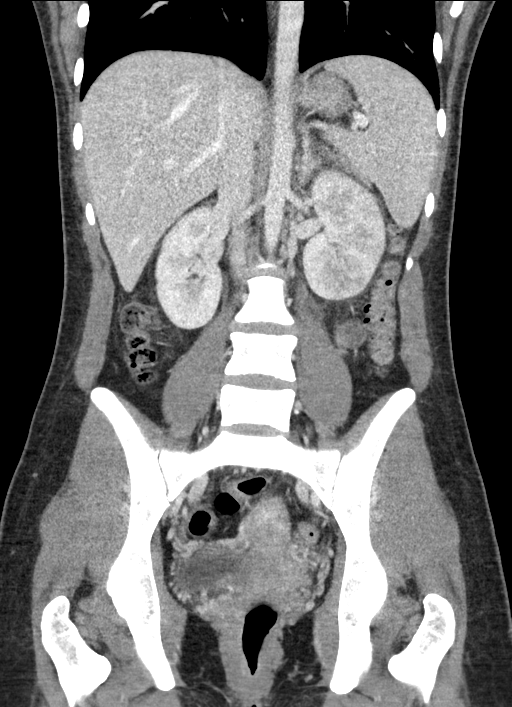

[13 of 46 positions shown; findings below may reference images not displayed]

FINDINGS: CTA CHEST FINDINGS

Cardiovascular: Contrast injection is sufficient to demonstrate
satisfactory opacification of the pulmonary arteries to the
segmental level. There is no pulmonary embolus or evidence of right
heart strain. The size of the main pulmonary artery is normal. Heart
size is normal, with no pericardial effusion. The course and caliber
of the aorta are normal. There is no atherosclerotic calcification.
Opacification decreased due to pulmonary arterial phase contrast
bolus timing.

Mediastinum/Nodes:

-- No mediastinal lymphadenopathy.

-- No hilar lymphadenopathy.

-- No axillary lymphadenopathy.

-- No supraclavicular lymphadenopathy.

-- Normal thyroid gland where visualized.

-  Unremarkable esophagus.

Lungs/Pleura: There is a 5 mm pulmonary nodule in the left lower
lobe (axial series 6, image 91). There is additional smaller 3-4 mm
pulmonary nodule in the peripheral left lower lobe (axial series 6,
image 93).

Musculoskeletal: No chest wall abnormality. No bony spinal canal
stenosis.

CT ABDOMEN and PELVIS FINDINGS

Hepatobiliary: The liver is normal. Normal gallbladder.There is no
biliary ductal dilation.

Pancreas: Normal contours without ductal dilatation. No
peripancreatic fluid collection.

Spleen: Unremarkable.

Adrenals/Urinary Tract:

--Adrenal glands: Unremarkable.

--Right kidney/ureter: There is a complex appearing, irregular
cystic structure in the right kidney measuring approximately 1.9 cm.
This structure measures approximately 36 Hounsfield units. There is
no right-sided hydronephrosis. There is some mild enhancement
throughout the right ureter.

--Left kidney/ureter: There is a striated appearance of the left
kidney without evidence for hydronephrosis. There is diffuse
enhancement throughout the left ureter.

--Urinary bladder: There is mild wall thickening of the urinary
bladder with adjacent fat stranding.

Stomach/Bowel:

--Stomach/Duodenum: No hiatal hernia or other gastric abnormality.
Normal duodenal course and caliber.

--Small bowel: Unremarkable.

--Colon: Unremarkable.

--Appendix: Normal.

Vascular/Lymphatic: Normal course and caliber of the major abdominal
vessels. There is probable mixing artifact in the bilateral common
femoral veins.

--No retroperitoneal lymphadenopathy.

--No mesenteric lymphadenopathy.

--No pelvic or inguinal lymphadenopathy.

Reproductive: There appears to be some fluid in the vaginal canal
and lower uterine segment.

Other: No ascites or free air. The abdominal wall is normal.

Musculoskeletal. There is very subtle avascular necrosis of the
bilateral femoral heads.

Review of the MIP images confirms the above findings.
IMPRESSION: 1. No acute pulmonary embolism.
2. Striated appearance of the left kidney, concerning for
pyelonephritis.
3. There is a complex appearing cystic structure in the right kidney
measuring approximately 1.9 cm. This is favored to represent a
proteinaceous or hemorrhagic cyst. An abscess is felt to be less
likely given the lack of findings suggestive of right-sided
pyelonephritis. A follow-up nonemergent renal ultrasound is
recommended for further evaluation of this finding.
4. There appears to be some fluid in the vaginal canal and lower
uterine segment. Correlation with patient's symptoms and menstrual
cycle is recommended.
5. Subtle avascular necrosis of the bilateral femoral heads.
6. Small pulmonary nodules in the left lower lobe, the largest of
which measures 5 mm. No follow-up needed if patient is low-risk.
Non-contrast chest CT can be considered in 12 months if patient is
high-risk. This recommendation follows the consensus statement:
Guidelines for Management of Incidental Pulmonary Nodules Detected

## 2021-07-04 IMAGING — US US RENAL
1 series · 14 of 25 positions shown · non-contrast
Comparison: CT abdomen pelvis 12/13/2019

CLINICAL DATA: Right renal lesion

EXAM:
RENAL / URINARY TRACT ULTRASOUND COMPLETE

[Series 1: us renal · 70 acquisitions, 14 frames shown]
[im 1/70]
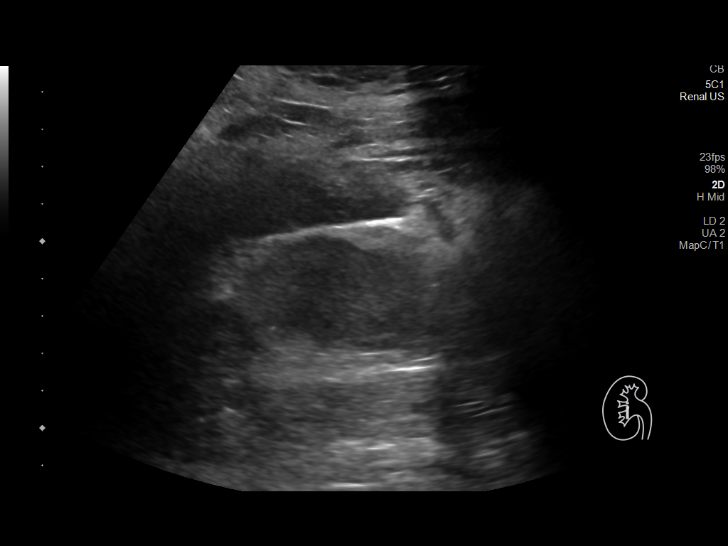
[im 6/70]
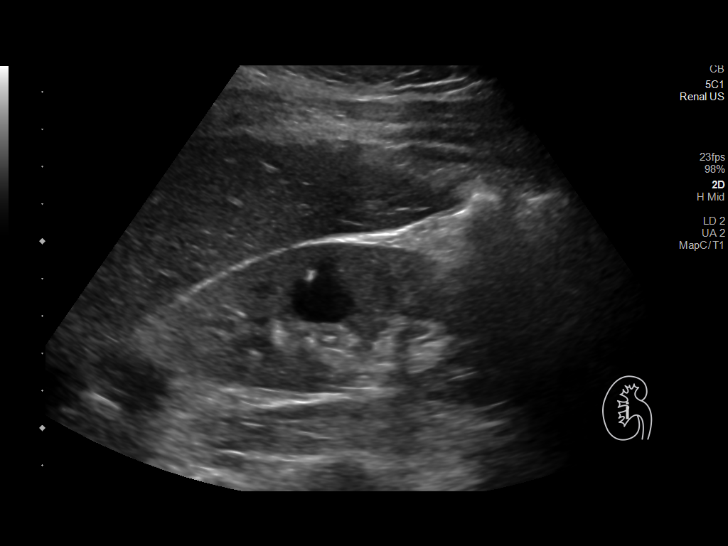
[im 12/70]
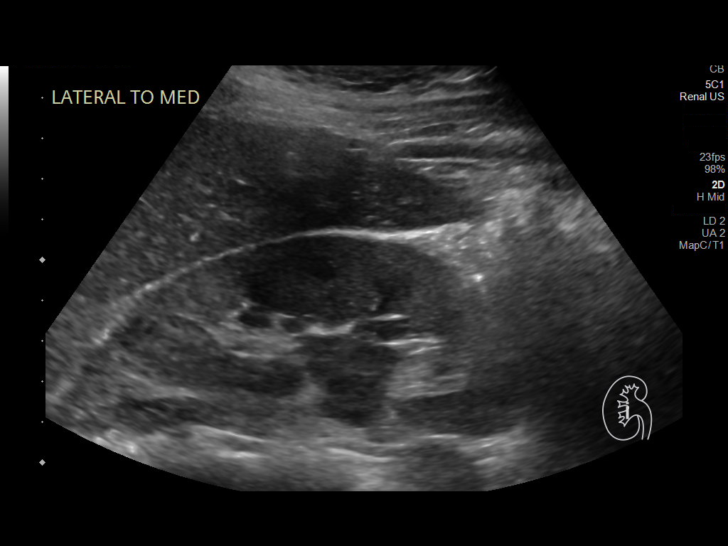
[im 18/70]
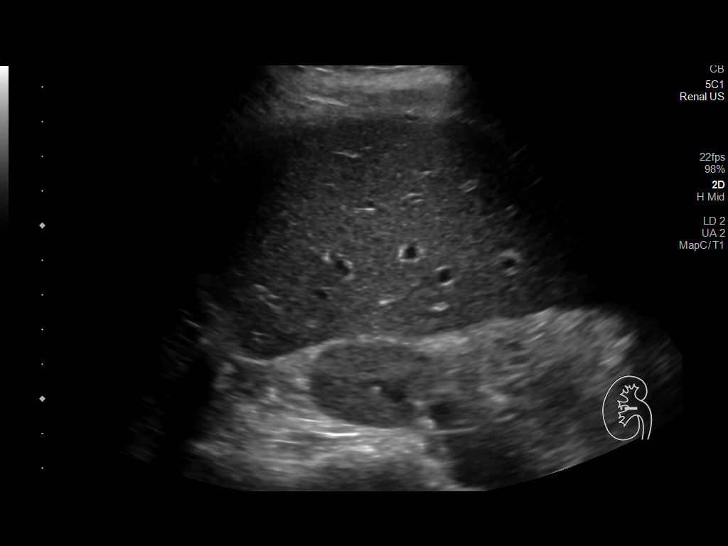
[im 24/70]
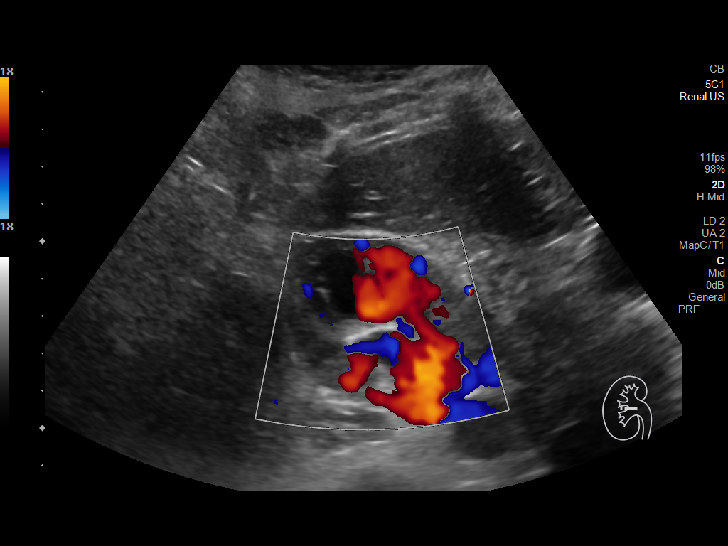
[im 26/70]
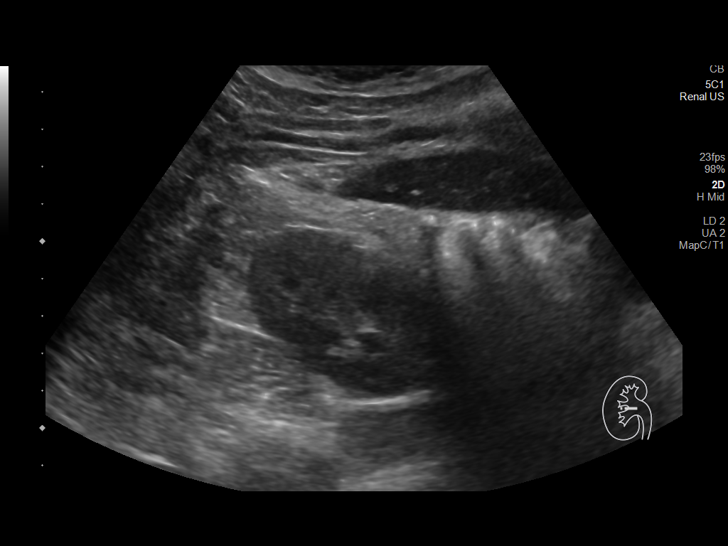
[im 32/70]
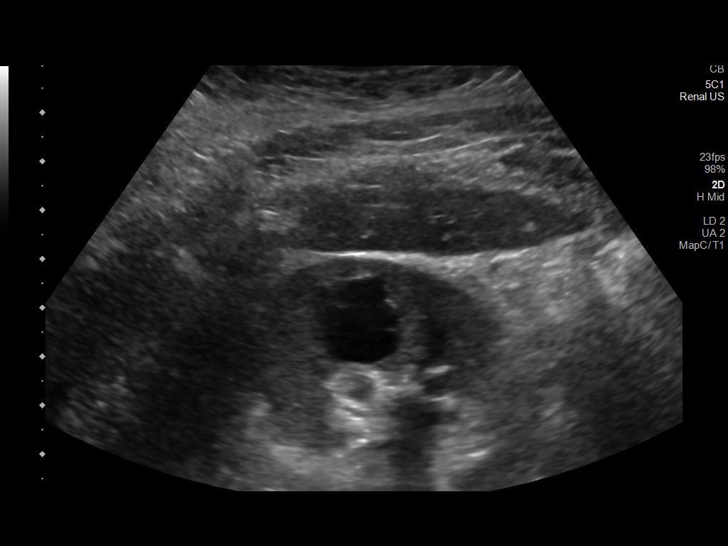
[im 38/70]
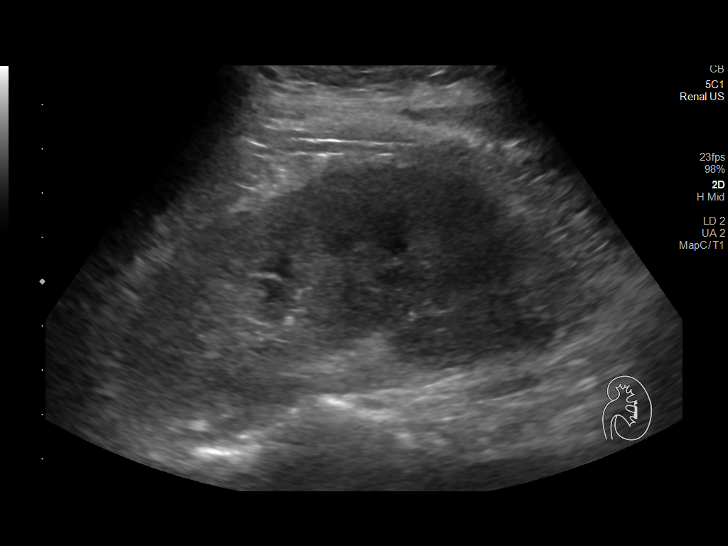
[im 44/70]
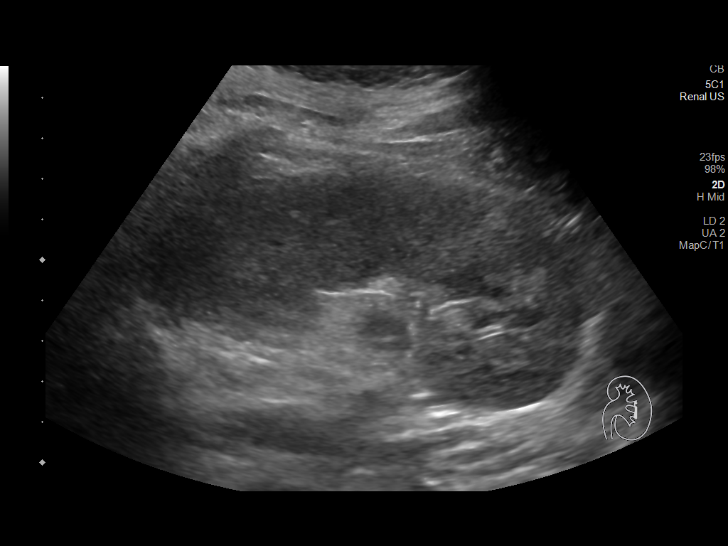
[im 47/70]
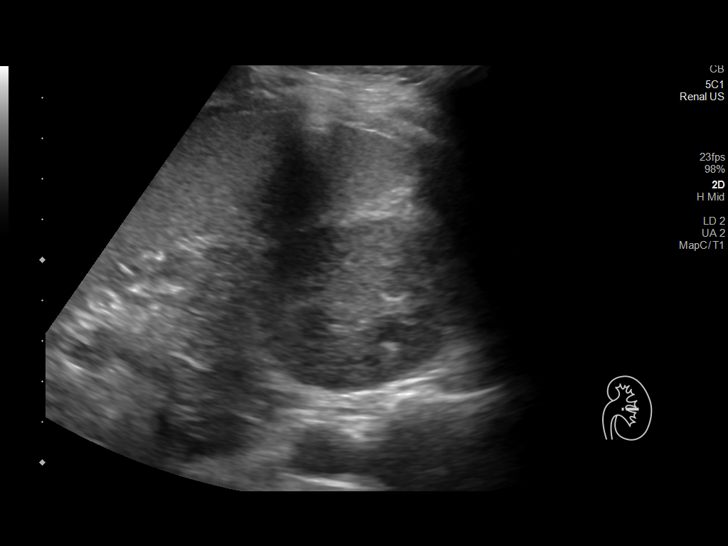
[im 52/70]
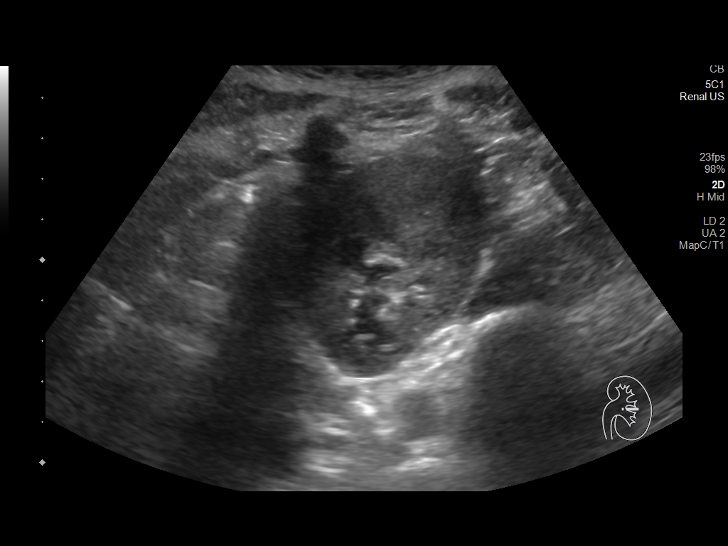
[im 58/70]
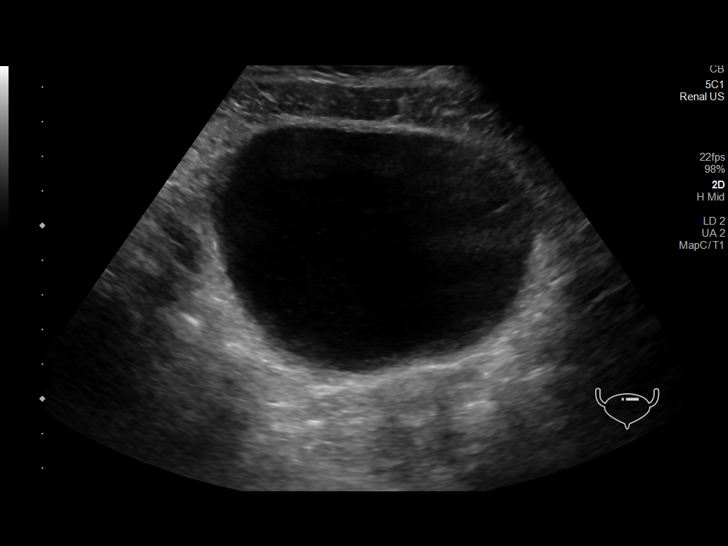
[im 64/70]
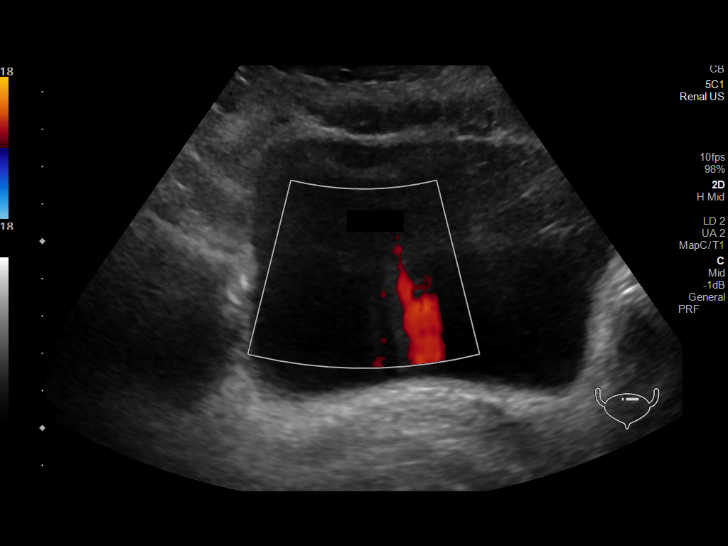
[im 70/70]
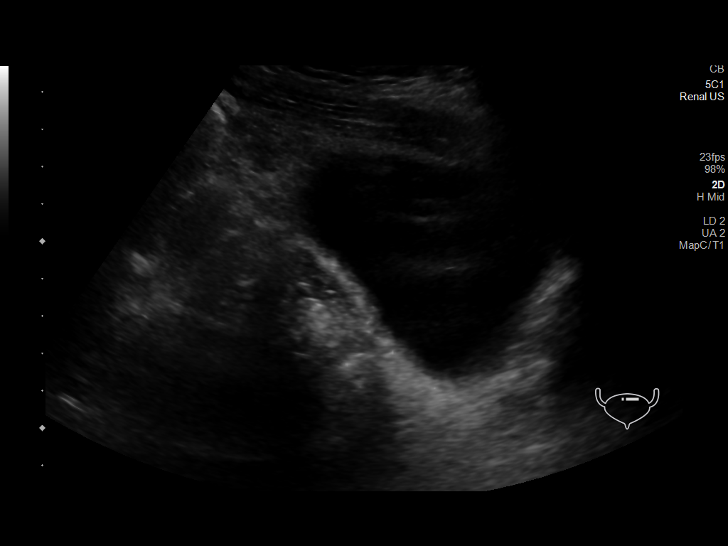

[14 of 25 positions shown; findings below may reference images not displayed]

FINDINGS: Right Kidney:

Renal measurements: 10.8 x 4.0 x 5.8 cm = volume: 129 mL. 1.8 cm
interpolar cyst corresponds to the lesion identified on the earlier
CT. There are low level echoes within the ureter. No hydronephrosis.

Left Kidney:

Renal measurements: 11.2 x 5.8 x 4.8 cm = volume: 162 mL. No
hydronephrosis or solid renal mass. There are low level echoes in
the ureter.

Bladder:

Low level echoes in the bladder.  Both ureteral jets were seen.

Other:

None.
IMPRESSION: 1. 1.8 cm interpolar right renal cyst. No solid renal mass.
2. Low-level echoes in the ureters and bladder, which may represent
debris; however, this finding may also be artifactual. Correlate
with urinalysis.

## 2023-04-14 NOTE — Progress Notes (Unsigned)
 New patient visit  Patient: Sonia Manning   DOB: [birth year]   28 y.o. Female  MRN: [id number] Visit Date: 04/15/2023  Today's healthcare provider: Debera Lat, PA-C   No chief complaint on file.  Subjective    Sonia Manning is a 28 y.o. female who presents today as a new patient to establish care.  HPI  *** Discussed the use of AI scribe software for clinical note transcription with the patient, who gave verbal consent to proceed.  History of Present Illness            No past medical history on file. Past Surgical History:  Procedure Laterality Date  . TONSILLECTOMY     Family Status  Relation Name Status  . Mother  Alive  . Father  Alive  . Sister  Alive  . Brother  Alive  . Sister  Alive  No partnership data on file   Family History  Problem Relation Age of Onset  . Healthy Mother   . Hyperlipidemia Father   . Healthy Sister   . Healthy Brother   . Healthy Sister    Social History   Socioeconomic History  . Marital status: Single    Spouse name: Not on file  . Number of children: Not on file  . Years of education: Not on file  . Highest education level: Not on file  Occupational History  . Not on file  Tobacco Use  . Smoking status: Never  . Smokeless tobacco: Never  Vaping Use  . Vaping status: Every Day  Substance and Sexual Activity  . Alcohol use: No  . Drug use: No  . Sexual activity: Not on file  Other Topics Concern  . Not on file  Social History Narrative  . Not on file   Social Drivers of Health   Financial Resource Strain: Not on file  Food Insecurity: Not on file  Transportation Needs: Not on file  Physical Activity: Not on file  Stress: Not on file  Social Connections: Unknown (06/13/2021)   Received from [facility]   Social Network   . Social Network: Not on file   Outpatient Medications Prior to Visit  Medication Sig  . benzonatate (TESSALON) 100 MG capsule Take 1 capsule (100 mg total) by mouth every 8 (eight)  hours.   No facility-administered medications prior to visit.   Not on File  Immunization History  Administered Date(s) Administered  . DTaP 11/10/1996, 02/26/1997, 03/24/1998, 10/26/1998, 07/10/2001  . HIB (PRP-OMP) 11/10/1996, 02/26/1997, 03/24/1998  . Hepatitis B 11/10/1996, 03/24/1998, 09/16/2017  . IPV 11/10/1996, 02/26/1997, 03/24/1998, 07/10/2001  . MMR 03/24/1998, 07/10/2001  . Tdap 06/28/2007  . Varicella 03/24/1998    Health Maintenance  Topic Date Due  . HPV VACCINES (1 - 3-dose series) Never done  . Hepatitis C Screening  Never done  . DTaP/Tdap/Td (7 - Td or Tdap) 06/27/2017  . Cervical Cancer Screening (Pap smear)  Never done  . INFLUENZA VACCINE  Never done  . COVID-19 Vaccine (3 - 2024-25 season) 09/30/2022  . HIV Screening  Completed    Patient Care Team: Dettinger, Elige Radon, MD as PCP - General (Family Medicine)  Review of Systems  All other systems reviewed and are negative. Except see HPI   {Insert previous labs (optional):23779} {See past labs  Heme  Chem  Endocrine  Serology  Results Review (optional):1}   Objective    There were no vitals taken for this visit. {Insert last BP/Wt (optional):23777}{See vitals history (optional):1}  Physical Exam Vitals reviewed.  Constitutional:      General: She is not in acute distress.    Appearance: Normal appearance. She is well-developed. She is not diaphoretic.  HENT:     Head: Normocephalic and atraumatic.  Eyes:     General: No scleral icterus.    Conjunctiva/sclera: Conjunctivae normal.  Neck:     Thyroid: No thyromegaly.  Cardiovascular:     Rate and Rhythm: Normal rate and regular rhythm.     Pulses: Normal pulses.     Heart sounds: Normal heart sounds. No murmur heard. Pulmonary:     Effort: Pulmonary effort is normal. No respiratory distress.     Breath sounds: Normal breath sounds. No wheezing, rhonchi or rales.  Musculoskeletal:     Cervical back: Neck supple.     Right lower  leg: No edema.     Left lower leg: No edema.  Lymphadenopathy:     Cervical: No cervical adenopathy.  Skin:    General: Skin is warm and dry.     Findings: No rash.  Neurological:     Mental Status: She is alert and oriented to person, place, and time. Mental status is at baseline.  Psychiatric:        Mood and Affect: Mood normal.        Behavior: Behavior normal.   Depression Screen    04/16/2018   10:32 AM  PHQ 2/9 Scores  PHQ - 2 Score 0   No results found for any visits on 04/15/23.  Assessment & Plan     *** Assessment and Plan              Encounter to establish care Welcomed to our clinic Reviewed past medical hx, social hx, family hx and surgical hx Pt advised to send all vaccination records or screening   No follow-ups on file.    The patient was advised to call back or seek an in-person evaluation if the symptoms worsen or if the condition fails to improve as anticipated.  I discussed the assessment and treatment plan with the patient. The patient was provided an opportunity to ask questions and all were answered. The patient agreed with the plan and demonstrated an understanding of the instructions.  I, Debera Lat, PA-C have reviewed all documentation for this visit. The documentation on  04/15/2023   for the exam, diagnosis, procedures, and orders are all accurate and complete.  Debera Lat, [facility], MMS [facility] [phone] (phone) [phone] (fax)  [facility]

## 2023-04-15 ENCOUNTER — Ambulatory Visit (INDEPENDENT_AMBULATORY_CARE_PROVIDER_SITE_OTHER): Payer: Managed Care, Other (non HMO) | Admitting: Physician Assistant

## 2023-04-15 ENCOUNTER — Encounter: Payer: Self-pay | Admitting: Physician Assistant

## 2023-04-15 VITALS — BP 97/58 | HR 73 | Resp 16 | Ht 63.0 in | Wt 122.0 lb

## 2023-04-15 DIAGNOSIS — Z7689 Persons encountering health services in other specified circumstances: Secondary | ICD-10-CM | POA: Diagnosis not present

## 2023-04-15 DIAGNOSIS — Z0000 Encounter for general adult medical examination without abnormal findings: Secondary | ICD-10-CM | POA: Diagnosis not present

## 2023-04-16 DIAGNOSIS — Z0000 Encounter for general adult medical examination without abnormal findings: Secondary | ICD-10-CM | POA: Insufficient documentation

## 2023-04-26 ENCOUNTER — Ambulatory Visit: Payer: Self-pay

## 2023-04-26 ENCOUNTER — Encounter: Admitting: Physician Assistant

## 2023-04-26 NOTE — Telephone Encounter (Signed)
 Copied from CRM [id number]. Topic: Clinical - Red Word Triage >> Apr 26, 2023  1:11 PM Emylou G wrote: [facility] that prompted transfer to Nurse Triage: blood in urine   Chief Complaint: Blood in urine. History of UTI. Symptoms: Above Frequency: Yesterday Pertinent Negatives: Patient denies any other symptoms "yet" Disposition: [] ED /[] Urgent Care (no appt availability in office) / [x] Appointment(In office/virtual)/ []  [place] [facility]/ [] Home Care/ [] Refused Recommended Disposition /[] [facility]/ []  Follow-up with PCP Additional Notes: Agrees with appointment.  Reason for Disposition  Urinating more frequently than usual (i.e., frequency)  Answer Assessment - Initial Assessment Questions 1. SYMPTOM: "What's the main symptom you're concerned about?" (e.g., frequency, incontinence)     Blood in urine 2. ONSET: "When did the    start?"     Yesterday 3. PAIN: "Is there any pain?" If Yes, ask: "How bad is it?" (Scale: 1-10; mild, moderate, severe)     No 4. CAUSE: "What do you think is causing the symptoms?"     UTI 5. OTHER SYMPTOMS: "Do you have any other symptoms?" (e.g., blood in urine, fever, flank pain, pain with urination)     No 6. PREGNANCY: "Is there any chance you are pregnant?" "When was your last menstrual period?"     No  Protocols used: Urinary Symptoms-A-AH

## 2023-04-28 NOTE — Progress Notes (Signed)
 Err

## 2023-04-28 NOTE — Progress Notes (Deleted)
 err

## 2023-04-30 ENCOUNTER — Encounter: Payer: Self-pay | Admitting: Family Medicine

## 2023-04-30 ENCOUNTER — Ambulatory Visit (INDEPENDENT_AMBULATORY_CARE_PROVIDER_SITE_OTHER): Admitting: Family Medicine

## 2023-04-30 VITALS — BP 113/76 | HR 81 | Ht 63.0 in | Wt 120.6 lb

## 2023-04-30 DIAGNOSIS — R319 Hematuria, unspecified: Secondary | ICD-10-CM | POA: Diagnosis not present

## 2023-04-30 DIAGNOSIS — Z87440 Personal history of urinary (tract) infections: Secondary | ICD-10-CM

## 2023-04-30 DIAGNOSIS — D72829 Elevated white blood cell count, unspecified: Secondary | ICD-10-CM | POA: Diagnosis not present

## 2023-04-30 LAB — POCT URINALYSIS DIPSTICK
Bilirubin, UA: NEGATIVE
Glucose, UA: NEGATIVE
Ketones, UA: NEGATIVE
Nitrite, UA: NEGATIVE
Protein, UA: POSITIVE — AB
Spec Grav, UA: >=1.03 — AB (ref 1.010–1.025)
Urobilinogen, UA: 0.2 U/dL
pH, UA: 6 (ref 5.0–8.0)

## 2023-04-30 MED ORDER — NITROFURANTOIN MONOHYD MACRO 100 MG PO CAPS: 100.0000 mg | ORAL_CAPSULE | Freq: Two times a day (BID) | ORAL | 0 refills | Status: AC

## 2023-04-30 NOTE — Progress Notes (Signed)
 Acute visit   Patient: Sonia Manning   DOB: April 15, 1995   28 y.o. Female  MRN: [id number] PCP: Debera Lat, PA-C   Chief Complaint  Patient presents with   Hematuria    Blood in urine X 6 days. Patient reports no pain,urge with little output and no possibility of pregnancy. Believes cause is a uti due to recent treatment. Hx of kidney damage.    Subjective    Discussed the use of AI scribe software for clinical note transcription with the patient, who gave verbal consent to proceed.  History of Present Illness   The patient, with a history of kidney damage secondary to sepsis from pyelonephritis, presents with concerns about recurrent UTIs. She reports that these UTIs have been occurring more frequently since the episode of sepsis, often coinciding with her menstrual period. The most recent UTI was treated with a seven-day course of Keflex, after which the patient felt fine for a couple of days before the symptoms returned. The patient describes the symptoms as an urge to urinate with minimal output and visible blood in the urine. She denies experiencing any burning sensation during urination. The patient's OBGYN has suggested the possibility of a low-dose antibiotic to prevent recurrent UTIs.        Review of Systems  Objective    BP 113/76 (BP Location: Left Arm, Patient Position: Sitting, Cuff Size: Normal)   Pulse 81   Ht 5\' 3"  (1.6 m)   Wt 120 lb 9.6 oz (54.7 kg)   BMI 21.36 kg/m  Physical Exam Vitals reviewed.  Constitutional:      General: She is not in acute distress.    Appearance: She is well-developed.  HENT:     Head: Normocephalic and atraumatic.  Eyes:     General: No scleral icterus.    Conjunctiva/sclera: Conjunctivae normal.  Cardiovascular:     Rate and Rhythm: Normal rate and regular rhythm.     Heart sounds: Normal heart sounds. No murmur heard. Pulmonary:     Effort: Pulmonary effort is normal. No respiratory distress.     Breath sounds:  Normal breath sounds. No wheezing or rales.  Abdominal:     General: There is no distension.     Palpations: Abdomen is soft.     Tenderness: There is no abdominal tenderness. There is no right CVA tenderness, left CVA tenderness, guarding or rebound.  Skin:    General: Skin is warm and dry.  Neurological:     Mental Status: She is alert.  Psychiatric:        Behavior: Behavior normal.       Results for orders placed or performed in visit on 04/30/23  POCT Urinalysis Dipstick  Result Value Ref Range   Color, UA     Clarity, UA     Glucose, UA Negative Negative   Bilirubin, UA negative    Ketones, UA negative    Spec Grav, UA >=1.030 (A) 1.010 - 1.025   Blood, UA moderate    pH, UA 6.0 5.0 - 8.0   Protein, UA Positive (A) Negative   Urobilinogen, UA 0.2 0.2 or 1.0 E.U./dL   Nitrite, UA negative    Leukocytes, UA Moderate (2+) (A) Negative   Appearance     Odor      Assessment & Plan     Problem List Items Addressed This Visit   None Visit Diagnoses       Hematuria, unspecified type    -  Primary   Relevant Orders   POCT Urinalysis Dipstick (Completed)   Urine Microscopic   Urine Culture     History of UTI       Relevant Medications   nitrofurantoin, macrocrystal-monohydrate, (MACROBID) 100 MG capsule   Other Relevant Orders   POCT Urinalysis Dipstick (Completed)     Leukocytosis, unspecified type       Relevant Orders   Urine Microscopic   Urine Culture           Recurrent Urinary Tract Infections (UTIs) She experiences recurrent UTIs, with two episodes in the past month. A previous episode of pyelonephritis in 2021 resulted in sepsis and kidney damage. The current UTI is characterized by hematuria and urinary urgency, without dysuria. Urinalysis reveals blood and leukocytes, suggesting a bacterial infection, likely E. coli given previous culture results, consistent with prior cultures. UTIs often coincide with her menstrual period. Discussed anatomical  susceptibility of women to UTIs and emphasized hygiene practices to prevent recurrence. She does not currently meet criteria for urology referral, which would be considered if more recurrent UTIs. Macrobid is selected due to recent Keflex use and sensitivity shown in previous cultures. Macrobid is well tolerated, taken twice daily for five days, and should be taken with food to avoid gastrointestinal upset. - Prescribe Macrobid (nitrofurantoin) 100 mg twice daily for 5 days. - Send urine culture to confirm antibiotic sensitivity. - Advise maintaining good hygiene, including wiping front to back and considering the use of a bidet. - Recommend a repeat urine test in 6 weeks if microscopic hematuria persists to ensure resolution. - Consider urology referral if more recurrent UTIs.       Meds ordered this encounter  Medications   nitrofurantoin, macrocrystal-monohydrate, (MACROBID) 100 MG capsule    Sig: Take 1 capsule (100 mg total) by mouth 2 (two) times daily for 5 days.    Dispense:  10 capsule    Refill:  0     Return if symptoms worsen or fail to improve.      Shirlee Latch, MD  [facility] [phone] (phone) [phone] (fax)  [facility]

## 2023-05-01 ENCOUNTER — Telehealth: Payer: Self-pay

## 2023-05-01 LAB — URINALYSIS, MICROSCOPIC ONLY
Bacteria, UA: NONE SEEN
Casts: NONE SEEN /LPF
Epithelial Cells (non renal): >10 /HPF — AB (ref 0–10)
RBC, Urine: >30 /HPF — AB (ref 0–2)
WBC, UA: >30 /HPF — AB (ref 0–5)

## 2023-05-01 NOTE — Telephone Encounter (Signed)
 Copied from CRM [id number]. Topic: Clinical - Medication Question >> May 01, 2023  8:37 AM Elle L wrote: Reason for CRM: The patient received her results on MyChart and is requesting a call back to go over them and is requesting to see if she should take the antibiotics or not. She has not started them yet. The patient's call back number is [phone].

## 2023-05-02 ENCOUNTER — Encounter: Payer: Self-pay | Admitting: Family Medicine

## 2023-05-02 NOTE — Telephone Encounter (Signed)
 Patient calling back as she has not heard from anyone in regards to lab results. Patient inquiring if she should start the antibiotics. Patient requesting a call back as soon as possible.   Best callback number: [phone]

## 2023-05-03 ENCOUNTER — Encounter: Payer: Self-pay | Admitting: Physician Assistant

## 2023-05-03 ENCOUNTER — Encounter: Payer: Self-pay | Admitting: Family Medicine

## 2023-05-03 ENCOUNTER — Ambulatory Visit: Payer: Self-pay

## 2023-05-03 LAB — URINE CULTURE

## 2023-05-03 NOTE — Telephone Encounter (Signed)
 Info only; no triage: Pt wanted to know results of UA and if taking correcting medication. RN read off provider notes and suggested she keep taking medication as prescribed. Pt had no further questions and concerns.           Copied from CRM [id number]. Topic: Clinical - Lab/Test Results >> May 03, 2023  3:31 PM Clayton Bibles wrote: Reason for CRM:  Please call Sonia Manning about her UTI results. She wants to know if she is taking the correct medication. Please call her at [phone] Thanks Reason for Disposition  Health Information question, no triage required and triager able to answer question  Answer Assessment - Initial Assessment Questions 1. REASON FOR CALL or QUESTION: "What is your reason for calling today?" or "How can I best help you?" or "What question do you have that I can help answer?"     UA results and if taking correct medication  Protocols used: Information Only Call - No Triage-A-AH

## 2024-03-05 ENCOUNTER — Other Ambulatory Visit: Payer: Self-pay

## 2024-03-05 ENCOUNTER — Ambulatory Visit: Payer: Self-pay | Admitting: *Deleted

## 2024-03-05 ENCOUNTER — Emergency Department
Admission: EM | Admit: 2024-03-05 | Discharge: 2024-03-05 | Disposition: A | Discharge status: Home / Self Care | Attending: Emergency Medicine | Admitting: Emergency Medicine

## 2024-03-05 DIAGNOSIS — B349 Viral infection, unspecified: Secondary | ICD-10-CM | POA: Insufficient documentation

## 2024-03-05 DIAGNOSIS — R519 Headache, unspecified: Secondary | ICD-10-CM

## 2024-03-05 DIAGNOSIS — R112 Nausea with vomiting, unspecified: Secondary | ICD-10-CM

## 2024-03-05 LAB — COMPREHENSIVE METABOLIC PANEL WITH GFR
ALT: 10 U/L (ref 0–44)
AST: 21 U/L (ref 15–41)
Albumin: 4.4 g/dL (ref 3.5–5.0)
Alkaline Phosphatase: 36 U/L — ABNORMAL LOW (ref 38–126)
Anion gap: 10 (ref 5–15)
BUN: 11 mg/dL (ref 6–20)
CO2: 24 mmol/L (ref 22–32)
Calcium: 8.8 mg/dL — ABNORMAL LOW (ref 8.9–10.3)
Chloride: 102 mmol/L (ref 98–111)
Creatinine, Ser: 0.7 mg/dL (ref 0.44–1.00)
GFR, Estimated: >60 mL/min
Glucose, Bld: 91 mg/dL (ref 70–99)
Potassium: 4 mmol/L (ref 3.5–5.1)
Sodium: 137 mmol/L (ref 135–145)
Total Bilirubin: 0.6 mg/dL (ref 0.0–1.2)
Total Protein: 7.4 g/dL (ref 6.5–8.1)

## 2024-03-05 LAB — URINALYSIS, ROUTINE W REFLEX MICROSCOPIC
Bilirubin Urine: NEGATIVE
Glucose, UA: NEGATIVE mg/dL
Ketones, ur: 5 mg/dL — AB
Leukocytes,Ua: NEGATIVE
Nitrite: NEGATIVE
Protein, ur: NEGATIVE mg/dL
Specific Gravity, Urine: 1.006 (ref 1.005–1.030)
pH: 7 (ref 5.0–8.0)

## 2024-03-05 LAB — RESP PANEL BY RT-PCR (RSV, FLU A&B, COVID)  RVPGX2
Influenza A by PCR: NEGATIVE
Influenza B by PCR: NEGATIVE
Resp Syncytial Virus by PCR: NEGATIVE
SARS Coronavirus 2 by RT PCR: NEGATIVE

## 2024-03-05 LAB — PREGNANCY, URINE: Preg Test, Ur: NEGATIVE

## 2024-03-05 LAB — CBC
HCT: 39.7 % (ref 36.0–46.0)
Hemoglobin: 13.6 g/dL (ref 12.0–15.0)
MCH: 29.2 pg (ref 26.0–34.0)
MCHC: 34.3 g/dL (ref 30.0–36.0)
MCV: 85.4 fL (ref 80.0–100.0)
Platelets: 123 10*3/uL — ABNORMAL LOW (ref 150–400)
RBC: 4.65 MIL/uL (ref 3.87–5.11)
RDW: 12.9 % (ref 11.5–15.5)
WBC: 4.7 10*3/uL (ref 4.0–10.5)
nRBC: 0 % (ref 0.0–0.2)

## 2024-03-05 LAB — LIPASE, BLOOD: Lipase: 34 U/L (ref 11–51)

## 2024-03-05 MED ORDER — SODIUM CHLORIDE 0.9 % IV BOLUS
1000.0000 mL | Freq: Once | INTRAVENOUS | Status: AC
  Administered 2024-03-05: 1000 mL via INTRAVENOUS

## 2024-03-05 MED ORDER — PROCHLORPERAZINE EDISYLATE 10 MG/2ML IJ SOLN
5.0000 mg | Freq: Once | INTRAMUSCULAR | Status: AC
  Administered 2024-03-05: 5 mg via INTRAVENOUS
  Filled 2024-03-05: qty 2

## 2024-03-05 MED ORDER — DIPHENHYDRAMINE HCL 50 MG/ML IJ SOLN
25.0000 mg | Freq: Once | INTRAMUSCULAR | Status: AC
  Administered 2024-03-05: 25 mg via INTRAVENOUS
  Filled 2024-03-05: qty 1

## 2024-03-05 MED ORDER — ONDANSETRON 4 MG PO TBDP: 4.0000 mg | ORAL_TABLET | Freq: Three times a day (TID) | ORAL | 0 refills | Status: AC | PRN

## 2024-03-05 NOTE — ED Provider Notes (Signed)
 "  [facility] Provider Note    Event Date/Time   First MD Initiated Contact with Patient 03/05/24 1026     (approximate)   History   Chief Complaint Fever   HPI  Miarose Lippert is a 29 y.o. female with past medical history of recurrent UTI who presents to the ED complaining of fever.  Patient reports that she has been feeling ill for the past 3 days with subjective fevers, cough, congestion, body aches, nausea, and vomiting.  She has not had any changes in her bowel movements and denies any associated abdominal pain.  She has not had any pain in her chest or difficulty breathing.  She also denies any dysuria, hematuria, or flank pain.  She does complain of a headache but denies any stiffness in her neck.  She is not aware of any sick contacts.  She spoke with her PCPs office earlier today, who recommended she come to the ED due to concern for ability to keep down fluids.      Physical Exam   Triage Vital Signs: ED Triage Vitals  Encounter Vitals Group     BP 03/05/24 0945 105/72     Girls Systolic BP Percentile --      Girls Diastolic BP Percentile --      Boys Systolic BP Percentile --      Boys Diastolic BP Percentile --      Pulse Rate 03/05/24 0945 (!) 106     Resp 03/05/24 0945 18     Temp 03/05/24 0945 98 F (36.7 C)     Temp src --      SpO2 03/05/24 0945 100 %     Weight 03/05/24 0944 120 lb (54.4 kg)     Height 03/05/24 0944 5' 3 (1.6 m)     Head Circumference --      Peak Flow --      Pain Score 03/05/24 0944 0     Pain Loc --      Pain Education --      Exclude from Growth Chart --     Most recent vital signs: Vitals:   03/05/24 0945  BP: 105/72  Pulse: (!) 106  Resp: 18  Temp: 98 F (36.7 C)  SpO2: 100%    Constitutional: Alert and oriented. Eyes: Conjunctivae are normal. Head: Atraumatic. Nose: No congestion/rhinnorhea. Mouth/Throat: Mucous membranes are moist.  Neck: Supple with no meningismus. Cardiovascular:  Normal rate, regular rhythm. Grossly normal heart sounds.  2+ radial pulses bilaterally. Respiratory: Normal respiratory effort.  No retractions. Lungs CTAB. Gastrointestinal: Soft and nontender. No distention. Musculoskeletal: No lower extremity tenderness nor edema.  Neurologic:  Normal speech and language. No gross focal neurologic deficits are appreciated.    ED Results / Procedures / Treatments   Labs (all labs ordered are listed, but only abnormal results are displayed) Labs Reviewed  COMPREHENSIVE METABOLIC PANEL WITH GFR - Abnormal; Notable for the following components:      Result Value   Calcium 8.8 (*)    Alkaline Phosphatase 36 (*)    All other components within normal limits  CBC - Abnormal; Notable for the following components:   Platelets 123 (*)    All other components within normal limits  URINALYSIS, ROUTINE W REFLEX MICROSCOPIC - Abnormal; Notable for the following components:   Color, Urine YELLOW (*)    APPearance HAZY (*)    Hgb urine dipstick MODERATE (*)    Ketones, ur 5 (*)  Bacteria, UA MANY (*)    All other components within normal limits  RESP PANEL BY RT-PCR (RSV, FLU A&B, COVID)  RVPGX2  LIPASE, BLOOD  PREGNANCY, URINE    PROCEDURES:  Critical Care performed: No  Procedures   MEDICATIONS ORDERED IN ED: Medications  prochlorperazine  (COMPAZINE ) injection 5 mg (5 mg Intravenous Given 03/05/24 1118)  diphenhydrAMINE  (BENADRYL ) injection 25 mg (25 mg Intravenous Given 03/05/24 1118)  sodium chloride  0.9 % bolus 1,000 mL (1,000 mLs Intravenous New Bag/Given 03/05/24 1118)     IMPRESSION / MDM / ASSESSMENT AND PLAN / ED COURSE  I reviewed the triage vital signs and the nursing notes.                              29 y.o. female with past medical history of recurrent UTI who presents to the ED complaining of 3 days of subjective fevers, cough, congestion, vomiting, and headache.  Patient's presentation is most consistent with acute presentation  with potential threat to life or bodily function.  Differential diagnosis includes, but is not limited to, COVID-19, influenza, other viral syndrome, pneumonia, meningitis, UTI, gastroenteritis, dehydration, electrolyte abnormality, AKI.  Patient well-appearing and in no acute distress, vital signs remarkable for mild tachycardia but otherwise reassuring.  She has a benign abdominal exam and does not have any difficulty breathing with lungs clear to auscultation bilaterally, do not feel chest x-ray or CT imaging of her abdomen needed at this time.  Symptoms seem most consistent with a viral illness, no features concerning for meningitis.  Viral panel currently pending hand labs are reassuring without significant anemia, leukocytosis, electrolyte abnormality, or AKI.  LFTs and lipase are unremarkable, pregnancy testing and urinalysis are pending.  Plan to treat symptomatically with IV Compazine  and Benadryl , hydrate with IV fluids.  Urinalysis is unremarkable, POC pregnancy testing was not performed for sample was sent to the lab, however patient states that there is no way she could be pregnant as she has been in a same-sex relationship for the past 5 years.  She is feeling much better following Compazine  and Benadryl , tolerating oral intake without difficulty and requesting to be discharged home.  She was counseled to return to the ED for new or worsening symptoms, patient agrees with plan.      FINAL CLINICAL IMPRESSION(S) / ED DIAGNOSES   Final diagnoses:  Viral illness  Acute nonintractable headache, unspecified headache type  Nausea and vomiting, unspecified vomiting type     Rx / DC Orders   ED Discharge Orders          Ordered    ondansetron  (ZOFRAN -ODT) 4 MG disintegrating tablet  Every 8 hours PRN        03/05/24 1525             Note:  This document was prepared using Dragon voice recognition software and may include unintentional dictation errors.   Willo Dunnings,  MD 03/05/24 1526  "

## 2024-03-05 NOTE — ED Triage Notes (Signed)
 Pt comes with fever, cough congestion and body aches. Pt states headache. Pt states vomiting since Monday. Pt denies any belly pain.

## 2024-03-05 NOTE — Telephone Encounter (Signed)
" °  FYI Only or Action Required?: FYI only for provider: ED advised.  Patient was last seen in primary care on 04/30/2023 by Myrla Jon HERO, MD.  Called Nurse Triage reporting Fever (Congestion, diarrhea, vomiting).  Symptoms began several days ago.  Interventions attempted: Rest, hydration, or home remedies.  Symptoms are: gradually worsening.  Triage Disposition: Go to ED Now (or PCP Triage)  Patient/caregiver understands and will follow disposition?: Yes  Message from [place] S sent at 03/05/2024  8:47 AM EST  Reason for Triage: Fever of 105 for 2 days and now 107, diarrhea, and vomiting   Reason for Disposition  [1] Drinking very little AND [2] dehydration suspected (e.g., no urine > 12 hours, very dry mouth, very lightheaded)  Answer Assessment - Initial Assessment Questions Patient reports multiple symptoms that started Monday- fever, diarrhea, vomiting, fatigue, dizziness. Patient advised ED for evaluation possible treatment due to severity of symptoms.  1. VOMITING SEVERITY: How many times have you vomited in the past 24 hours?      3am- 6:30am- stayed in bathroom 2. ONSET: When did the vomiting begin?      Monday night- gotten worse 3. FLUIDS: What fluids or food have you vomited up today? Have you been able to keep any fluids down?     Throughout the day patient can keep fluids/toast down- vomiting starts at night 4. ABDOMEN PAIN: Are your having any abdomen pain? If Yes : How bad is it and what does it feel like? (e.g., crampy, dull, intermittent, constant)      Not consistent  5. DIARRHEA: Is there any diarrhea? If Yes, ask: How many times today?      Yes- 3 times today, watery stage- Monday 6. CONTACTS: Is there anyone else in the family with the same symptoms?      no 7. CAUSE: What do you think is causing your vomiting?     Unsure- fever 100.5- today 100.7- oral 8. HYDRATION STATUS: Any signs of dehydration? (e.g., dry mouth [not only dry  lips], too weak to stand) When did you last urinate?     Dizziness, weakness, dry mouth 9. OTHER SYMPTOMS: Do you have any other symptoms? (e.g., fever, headache, vertigo, vomiting blood or coffee grounds, recent head injury)     Fever, dizziness  Protocols used: Vomiting-A-AH  "

## 2024-04-16 ENCOUNTER — Encounter: Admitting: Physician Assistant
